# Patient Record
Sex: Female | Born: 1945 | Race: White | Hispanic: No | Marital: Married | Smoking: Former smoker
Health system: Southern US, Community
[De-identification: ages and names within clinical notes are randomized; demographics above are authoritative.]

## PROBLEM LIST (undated history)

## (undated) DIAGNOSIS — Z8489 Family history of other specified conditions: Secondary | ICD-10-CM

## (undated) DIAGNOSIS — T8859XA Other complications of anesthesia, initial encounter: Secondary | ICD-10-CM

## (undated) DIAGNOSIS — L409 Psoriasis, unspecified: Secondary | ICD-10-CM

## (undated) DIAGNOSIS — F419 Anxiety disorder, unspecified: Secondary | ICD-10-CM

## (undated) DIAGNOSIS — I10 Essential (primary) hypertension: Secondary | ICD-10-CM

## (undated) DIAGNOSIS — H3530 Unspecified macular degeneration: Secondary | ICD-10-CM

## (undated) DIAGNOSIS — Z9289 Personal history of other medical treatment: Secondary | ICD-10-CM

## (undated) DIAGNOSIS — M21371 Foot drop, right foot: Secondary | ICD-10-CM

## (undated) DIAGNOSIS — T4145XA Adverse effect of unspecified anesthetic, initial encounter: Secondary | ICD-10-CM

## (undated) HISTORY — PX: HEMORRHOID SURGERY: SHX153

## (undated) HISTORY — PX: LUMBAR FUSION: SHX111

## (undated) HISTORY — PX: COLONOSCOPY: SHX174

---

## 2004-07-31 HISTORY — PX: JOINT REPLACEMENT: SHX530

## 2005-01-09 ENCOUNTER — Ambulatory Visit (HOSPITAL_COMMUNITY): Admission: RE | Admit: 2005-01-09 | Discharge: 2005-01-12 | Payer: Self-pay | Admitting: Orthopedic Surgery

## 2007-04-18 ENCOUNTER — Ambulatory Visit (HOSPITAL_COMMUNITY): Admission: RE | Admit: 2007-04-18 | Discharge: 2007-04-18 | Payer: Self-pay | Admitting: Orthopedic Surgery

## 2010-12-16 NOTE — Op Note (Signed)
NAMENONIE, LOCHNER              ACCOUNT NO.:  [id number]   MEDICAL RECORD NO.:  [id number]          PATIENT TYPE:  OIB   LOCATION:  5022                         FACILITY:  [facility]   PHYSICIAN:  Mila Homer. Sherlean Foot, M.D. DATE OF BIRTH:  May 19, 1946   DATE OF PROCEDURE:  01/09/2005  DATE OF DISCHARGE:                                 OPERATIVE REPORT   SURGEON:  Mila Homer. Sherlean Foot, M.D.   ASSISTANT:  Richardean Canal, P.A.-C.   ANESTHESIA:  General.   PREOPERATIVE DIAGNOSIS:  Left knee osteoarthritis.   POSTOPERATIVE DIAGNOSIS:  Left knee osteoarthritis.   PROCEDURE:  Left total knee arthroplasty.   INDICATIONS FOR PROCEDURE:  The patient is a 65 year old white female with  failure of conservative measures for osteoarthritis of the knee.  Informed  consent was obtained.   DESCRIPTION OF PROCEDURE:  The patient was laid supine and administered  general anesthesia after a preoperative femoral nerve block.  A Foley  catheter was placed.  The left lower extremity was then prepped and draped  in the usual sterile fashion.  A #10 blade was used to make a standard  midline incision approximately 10 cm in length.  A fresh blade was used to  make a straight median parapatellar arthrotomy.  A synovectomy was also  performed.  I then everted the patella measured at 21 mm thick, reamed down  to 12 mm thick, and used a 32 mm template to drill three lug holes.  With  the prosthetic trial in place, 21 mm thickness had been recreated.  I then  went into flexion.  I used the extramedullary alignment guide on the tibia  to make a perpendicular cut to the anatomic axis removing 2 mm of bone off  the joint surface.  I then removed the extramedullary guide and made an  intramedullary drill hole in the femur.  I then put the intramedullary guide  set on 6 degrees valgus cut and made the cut with a sagittal saw.  I then  marked out the epicondylar axis, posterior angle measured 3 degrees, I then  pinned the  size E cutting block into place and made the anterior, posterior,  and chamfer cuts with a sagittal saw.  At this point, I removed the cut  surfaces of the bone and the cutting block.  I then placed the laminar  spreader in the knee, removed the medial and lateral meniscus, ACL, PCL, and  posterior condylar osteophytes.  A 12 spacer block fit easily in flexion and  extension, balance was good, alignment was excellent.  I then finished the  tibia with a size 4 tibial tray during keel, sized the femur with a size E  cutting for the box.  I then trialed with a size 4 tibia, size E femur, size  12 insert, and 32 patella.  The patella tracked well.  The drop in angle was  125 degrees with flexion and extension gap balance excellent.  I then  removed the trial components and copiously irrigated.  I then cemented in  the tibial first, femur second, and removed all excess cement.  I then  snapped in the 12 mm polyethylene, located the knee in extension, and  cemented in the patella and removed excess cement there.  Once the cement  was hardened, I let the tourniquet down at 49 minutes.  I left a drain  coming out superolaterally and deep to the arthrotomy.  I closed the  arthrotomy with interrupted #1 Vicryl sutures, the deep soft tissues with  interrupted 0 Vicryl sutures, and a subcuticular 2-0 Vicryl stitch, and skin  staples.  I dressed the knee with Xeroform dressings, sponges, sterile  Webril and Ace wrap.  Complications none.  Drains one Hemovac.  Estimated  blood loss 300 mL.       SDL/MEDQ  D:  01/11/2005  T:  01/11/2005  Job:  [id number]

## 2010-12-16 NOTE — Discharge Summary (Signed)
NAMESIVAN, CUELLO              ACCOUNT NO.:  [id number]   MEDICAL RECORD NO.:  [id number]          PATIENT TYPE:  OIB   LOCATION:  5022                         FACILITY:  [facility]   PHYSICIAN:  Mila Homer. Sherlean Foot, M.D. DATE OF BIRTH:  1946/06/29   DATE OF ADMISSION:  01/09/2005  DATE OF DISCHARGE:  01/12/2005                                 DISCHARGE SUMMARY   ADMISSION DIAGNOSES:  1.  Severe osteoarthritis left knee.  2.  Hypertension.  3.  History of anxiety depression.   DISCHARGE DIAGNOSES:  1.  Left total knee arthroplasty.  2.  Asymptomatic postoperative  blood loss anemia.  3.  History of hypertension.  4.  History of anxiety depression.   PROCEDURE:  On January 09, 2005, patient was taken to the OR by Dr. Mila Homer.  Lucey, assisted by Richardean Canal, P.A.-C.  Under general anesthesia, the  patient underwent a left total knee arthroplasty without any complications.  The patient had the following components implanted, a size E left femoral  component, size 4 fluted stemmed tibial component, size 32 mm patella, a  size 12 mm polyethylene bearing.  All components were implanted with  polymethyl methacrylate.  The patient tolerated that well.  She was  transferred to the recovery room with IV antibiotics and pain medications,  her leg in the CPM and a total knee protocol on Lovenox for DVT prophylaxis.   CONSULTATIONS:  The following routine consults were requested:  1.  Physical therapy.  2.  Occupational therapy.   HOSPITAL COURSE:  On January 09, 2005, patient was admitted to [facility] under the creatinine of Mila Homer. Sherlean Foot, M.D.  Patient  was taken to the OR where a left total knee arthroplasty was performed  without any complications.  The patient tolerated the procedure well and was  transferred to the recovery room, placed in a CPM and then transferred to  the orthopedic floor for a total knee protocol with IV pain medications and  antibiotics.   Patient then incurred a total of three days postoperative  course in which she worked very well with physical therapy.  Patient  transitioned from IV medications to p.o. medications very well without any  problems.  She did develop some postoperative blood loss anemia but her  vital signs remained stable and the patient tolerated it well so no  transfusion was required.  No medical issues or concerns occurred.  So it  was felt on postoperative day #3, with the patient's vital signs stable, her  wound benign for any signs of infection, her leg remained neuromotor  vascularly intact and worked very well with physical therapy, that the  patient be discharged to home for outpatient home health physical therapy  per total knee protocol.  Patient was discharged in good condition.   LABORATORY DATA:  CBC on January 11, 2005, wbc 9.4, hemoglobin 9.7, hematocrit  28.1, platelets 208.  Routine chemistries, sodium 137, potassium 3.5,  glucose 121 felt to be postoperative stress and elevation from inactivity,  was 103 on admission, no change in treatment.  BUN  7, creatinine 0.8.   MEDICATIONS ON DISCHARGE FROM ORTHOPEDIC FLOOR:  1.  Colace 100 mg p.o. b.i.d.  2.  Trinsicon one tablet p.o. t.i.d.  3.  Lovenox 30 mg injection q.12h.  4.  Norvasc 5 mg p.o. daily.  5.  Lotensin 10 mg p.o. daily.  6.  Prozac 20 mg p.o. daily.  7.  Laxative or enema of choice p.r.n.  8.  Percocet one or two tablets every four to six hours p.r.n.  9.  Tylenol 650 mg p.o. q.4h. p.r.n.  10. Robaxin 500 mg p.o. q.6h. p.r.n.  11. Reglan 10 mg p.o. q.8h. p.r.n.   DISCHARGE INSTRUCTIONS:  1.  Diet:  No restrictions.  2.  Activity:  Patient has no restrictions, may be weightbearing as      tolerated with use of a walker.  May shower on Saturday.  May go up and      down steps.  Patient should wear TEDs on the surgical leg for two to      three weeks until swelling is gone.   WOUND CARE:  Patient should change dressing daily.   If any concerns of  infection or knee problems, patient is to call Dr. Tobin Chad office for  advise.   MEDICATIONS:  1.  Percocet 5 mg one or two tablets q.4-6h. for pain if needed.  2.  Robaxin 500 mg one tablet q.6-8h. for muscle spasms if needed.  3.  Lovenox 40 mg injection once a day for the next 10 days.  4.  Patient resume routine home medications.   FOLLOW UP:  Patient needs a follow-up appointment with Dr. Sherlean Foot in three to  four weeks in our [place], [place], office.  Patient is to call  [phone] for a follow-up appointment.  Patient needs to contact in 10  days local orthopedist for evaluation of the wound, staple removal, Steri-  Strips application since the patient does live in [place] and Dr.  Sherlean Foot and the patient have agreed to this course of action.   CONDITION ON DISCHARGE:  Improved and good.      Robe   RWK/MEDQ  D:  01/12/2005  T:  01/12/2005  Job:  [id number]

## 2014-12-29 ENCOUNTER — Other Ambulatory Visit: Payer: Self-pay | Admitting: Neurosurgery

## 2015-03-01 ENCOUNTER — Encounter (HOSPITAL_COMMUNITY)
Admission: RE | Admit: 2015-03-01 | Discharge: 2015-03-01 | Disposition: A | Discharge status: Home / Self Care | Payer: Medicare Other | Source: Ambulatory Visit | Attending: Neurosurgery | Admitting: Neurosurgery

## 2015-03-01 ENCOUNTER — Other Ambulatory Visit: Payer: Self-pay

## 2015-03-01 ENCOUNTER — Encounter (HOSPITAL_COMMUNITY): Payer: Self-pay

## 2015-03-01 HISTORY — DX: Essential (primary) hypertension: I10

## 2015-03-01 HISTORY — DX: Psoriasis, unspecified: L40.9

## 2015-03-01 HISTORY — DX: Anxiety disorder, unspecified: F41.9

## 2015-03-01 LAB — TYPE AND SCREEN
ABO/RH(D): B NEG
ANTIBODY SCREEN: NEGATIVE

## 2015-03-01 LAB — BASIC METABOLIC PANEL
ANION GAP: 7 (ref 5–15)
BUN: 12 mg/dL (ref 6–20)
CALCIUM: 9 mg/dL (ref 8.9–10.3)
CHLORIDE: 100 mmol/L — AB (ref 101–111)
CO2: 30 mmol/L (ref 22–32)
Creatinine, Ser: 0.79 mg/dL (ref 0.44–1.00)
Glucose, Bld: 93 mg/dL (ref 65–99)
Potassium: 3.8 mmol/L (ref 3.5–5.1)
SODIUM: 137 mmol/L (ref 135–145)

## 2015-03-01 LAB — CBC
HEMATOCRIT: 41.2 % (ref 36.0–46.0)
Hemoglobin: 13.5 g/dL (ref 12.0–15.0)
MCH: 31.3 pg (ref 26.0–34.0)
MCHC: 32.8 g/dL (ref 30.0–36.0)
MCV: 95.6 fL (ref 78.0–100.0)
PLATELETS: 251 10*3/uL (ref 150–400)
RBC: 4.31 MIL/uL (ref 3.87–5.11)
RDW: 12.3 % (ref 11.5–15.5)
WBC: 6.3 10*3/uL (ref 4.0–10.5)

## 2015-03-01 LAB — ABO/RH: ABO/RH(D): B NEG

## 2015-03-01 LAB — SURGICAL PCR SCREEN
MRSA, PCR: NEGATIVE
STAPHYLOCOCCUS AUREUS: NEGATIVE

## 2015-03-01 NOTE — Pre-Procedure Instructions (Signed)
    Hannah Henson  03/01/2015     No Pharmacies Listed   Your procedure is scheduled on March 04, 2015  Report to [facility] Admitting at 5:30 A.M.  Call this number if you have problems the morning of surgery:  [phone]   Remember:  Do not eat food or drink liquids after midnight.  Take these medicines the morning of surgery with A SIP OF WATER : venlafaxine (EFFEXOR)   STOP ASPIRIN, HERBAL MEDICATIONS 7 DAYS PRIOR TO SURGERY   Do not wear jewelry, make-up or nail polish.  Do not wear lotions, powders, or perfumes.  You may wear deodorant.  Do not shave 48 hours prior to surgery.    Do not bring valuables to the hospital.  [facility] is not responsible for any belongings or valuables.  Contacts, dentures or bridgework may not be worn into surgery.  Leave your suitcase in the car.  After surgery it may be brought to your room.  For patients admitted to the hospital, discharge time will be determined by your treatment team.  Patients discharged the day of surgery will not be allowed to drive home.   Name and phone number of your driver:    Special instructions:  "PREPARING FOR SURGERY"  Please read over the following fact sheets that you were given. Pain Booklet, Coughing and Deep Breathing, Blood Transfusion Information and Surgical Site Infection Prevention

## 2015-03-02 ENCOUNTER — Encounter (HOSPITAL_COMMUNITY): Payer: Self-pay

## 2015-03-02 NOTE — Progress Notes (Signed)
Anesthesia Chart Review: Patient is a 69 year old female scheduled for L4-5 laminectomy with PLIF on 03/04/15 by Dr. Lovell Sheehan.  History includes former smoker, HTN, anxiety, plaque psoriasis, left TKR '06. Patient lives in [place], [place]. She has friends in the area, and has had previous surgeries at [facility]. She prefers our hospital over the hospitals in [place]. PCP is Dr. Marrian Salvage.   Meds include Humira every 14 days (last dose 02/23/15 1 AM), Lotrel, Tranxene, Effexor, melatonin.   EKG 03/01/15 NSR, possible inferior infarct (age undetermined). No comparison EKGs available. She denied CP, SOB, syncope, palpitation history. Chronic mild ankle edema that improves with BLE elevation. No known She walks up 10 stairs on a regular basis at home, but overall activity is limited due to her back issues. She ambulates with a cane since ~ 07/2014. Overall, she feels she lives an active life. 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   Preoperative labs noted.   She denied CV symptoms. Reviewed above with anesthesiologist Dr. Maple Hudson who agrees that if no acute changes then anticipate that she can proceed as planned. Of note, patient did ask that I notify Dr. York Ram office of her last dose and next scheduled dose Humira, so he can make any recommendations on when to resume. I have left a voice message with Darl Pikes at his office. I also encouraged patient to clarify with Dr. Lovell Sheehan when she meets with him during her hospitalization.  Velna Ochs [facility]/Anesthesiology Phone [phone] 03/02/2015 1:12 PM

## 2015-03-04 ENCOUNTER — Encounter (HOSPITAL_COMMUNITY)
Admission: RE | Disposition: A | Discharge status: Home / Self Care | Payer: Self-pay | Source: Ambulatory Visit | Attending: Neurosurgery

## 2015-03-04 ENCOUNTER — Inpatient Hospital Stay (HOSPITAL_COMMUNITY): Payer: Medicare Other

## 2015-03-04 ENCOUNTER — Inpatient Hospital Stay (HOSPITAL_COMMUNITY)
Admission: RE | Admit: 2015-03-04 | Discharge: 2015-03-07 | DRG: 460 | Disposition: A | Discharge status: Home / Self Care | Payer: Medicare Other | Source: Ambulatory Visit | Attending: Neurosurgery | Admitting: Neurosurgery

## 2015-03-04 ENCOUNTER — Inpatient Hospital Stay (HOSPITAL_COMMUNITY): Payer: Medicare Other | Admitting: Certified Registered Nurse Anesthetist

## 2015-03-04 ENCOUNTER — Encounter (HOSPITAL_COMMUNITY): Payer: Self-pay | Admitting: *Deleted

## 2015-03-04 ENCOUNTER — Inpatient Hospital Stay (HOSPITAL_COMMUNITY): Payer: Medicare Other | Admitting: Vascular Surgery

## 2015-03-04 DIAGNOSIS — M5116 Intervertebral disc disorders with radiculopathy, lumbar region: Secondary | ICD-10-CM | POA: Diagnosis present

## 2015-03-04 DIAGNOSIS — I10 Essential (primary) hypertension: Secondary | ICD-10-CM | POA: Diagnosis present

## 2015-03-04 DIAGNOSIS — M4806 Spinal stenosis, lumbar region: Secondary | ICD-10-CM | POA: Diagnosis present

## 2015-03-04 DIAGNOSIS — Z87891 Personal history of nicotine dependence: Secondary | ICD-10-CM | POA: Diagnosis not present

## 2015-03-04 DIAGNOSIS — M21371 Foot drop, right foot: Secondary | ICD-10-CM | POA: Diagnosis present

## 2015-03-04 DIAGNOSIS — Z96652 Presence of left artificial knee joint: Secondary | ICD-10-CM | POA: Diagnosis present

## 2015-03-04 DIAGNOSIS — L409 Psoriasis, unspecified: Secondary | ICD-10-CM | POA: Diagnosis present

## 2015-03-04 DIAGNOSIS — M79604 Pain in right leg: Secondary | ICD-10-CM | POA: Diagnosis present

## 2015-03-04 DIAGNOSIS — Z419 Encounter for procedure for purposes other than remedying health state, unspecified: Secondary | ICD-10-CM

## 2015-03-04 DIAGNOSIS — M4316 Spondylolisthesis, lumbar region: Secondary | ICD-10-CM | POA: Diagnosis present

## 2015-03-04 DIAGNOSIS — F419 Anxiety disorder, unspecified: Secondary | ICD-10-CM | POA: Diagnosis present

## 2015-03-04 LAB — GLUCOSE, CAPILLARY: Glucose-Capillary: 106 mg/dL — ABNORMAL HIGH (ref 65–99)

## 2015-03-04 SURGERY — POSTERIOR LUMBAR FUSION 1 LEVEL
Anesthesia: General

## 2015-03-04 MED ORDER — FENTANYL CITRATE (PF) 250 MCG/5ML IJ SOLN
INTRAMUSCULAR | Status: AC
  Filled 2015-03-04: qty 5

## 2015-03-04 MED ORDER — BACITRACIN ZINC 500 UNIT/GM EX OINT
TOPICAL_OINTMENT | CUTANEOUS | Status: DC | PRN
  Administered 2015-03-04: 1 via TOPICAL

## 2015-03-04 MED ORDER — SODIUM CHLORIDE 0.9 % IR SOLN
Status: DC | PRN
  Administered 2015-03-04: 09:00:00

## 2015-03-04 MED ORDER — CEFAZOLIN SODIUM-DEXTROSE 2-3 GM-% IV SOLR
2.0000 g | INTRAVENOUS | Status: AC
  Administered 2015-03-04: 2 g via INTRAVENOUS

## 2015-03-04 MED ORDER — HYDROCODONE-ACETAMINOPHEN 5-325 MG PO TABS
1.0000 | ORAL_TABLET | ORAL | Status: DC | PRN
  Administered 2015-03-05 – 2015-03-07 (×8): 2 via ORAL
  Filled 2015-03-04 (×8): qty 2

## 2015-03-04 MED ORDER — AMLODIPINE BESY-BENAZEPRIL HCL 5-10 MG PO CAPS: 1.0000 | ORAL_CAPSULE | Freq: Every day | ORAL | Status: DC

## 2015-03-04 MED ORDER — HYDROMORPHONE HCL 1 MG/ML IJ SOLN
0.2500 mg | INTRAMUSCULAR | Status: DC | PRN
  Administered 2015-03-04 (×4): 0.5 mg via INTRAVENOUS

## 2015-03-04 MED ORDER — THROMBIN 20000 UNITS EX SOLR
CUTANEOUS | Status: DC | PRN
  Administered 2015-03-04: 10:00:00 via TOPICAL

## 2015-03-04 MED ORDER — VENLAFAXINE HCL 37.5 MG PO TABS
75.0000 mg | ORAL_TABLET | Freq: Every day | ORAL | Status: DC
  Administered 2015-03-05 – 2015-03-07 (×3): 75 mg via ORAL
  Filled 2015-03-04 (×3): qty 2

## 2015-03-04 MED ORDER — BUPIVACAINE LIPOSOME 1.3 % IJ SUSP
20.0000 mL | Freq: Once | INTRAMUSCULAR | Status: DC
  Filled 2015-03-04: qty 20

## 2015-03-04 MED ORDER — AMLODIPINE BESYLATE 5 MG PO TABS
5.0000 mg | ORAL_TABLET | Freq: Every day | ORAL | Status: DC
  Administered 2015-03-05 – 2015-03-07 (×3): 5 mg via ORAL
  Filled 2015-03-04 (×3): qty 1

## 2015-03-04 MED ORDER — LACTATED RINGERS IV SOLN
INTRAVENOUS | Status: DC
  Administered 2015-03-05: 01:00:00 via INTRAVENOUS

## 2015-03-04 MED ORDER — PROPOFOL 10 MG/ML IV BOLUS
INTRAVENOUS | Status: DC | PRN
  Administered 2015-03-04: 150 mg via INTRAVENOUS

## 2015-03-04 MED ORDER — MENTHOL 3 MG MT LOZG: 1.0000 | LOZENGE | OROMUCOSAL | Status: DC | PRN

## 2015-03-04 MED ORDER — ONDANSETRON HCL 4 MG/2ML IJ SOLN
INTRAMUSCULAR | Status: DC | PRN
  Administered 2015-03-04: 4 mg via INTRAVENOUS

## 2015-03-04 MED ORDER — EPHEDRINE SULFATE 50 MG/ML IJ SOLN
INTRAMUSCULAR | Status: DC | PRN
  Administered 2015-03-04 (×5): 5 mg via INTRAVENOUS

## 2015-03-04 MED ORDER — MECLIZINE HCL 25 MG PO TABS
25.0000 mg | ORAL_TABLET | Freq: Once | ORAL | Status: DC
  Filled 2015-03-04: qty 1

## 2015-03-04 MED ORDER — MEPERIDINE HCL 25 MG/ML IJ SOLN: 6.2500 mg | INTRAMUSCULAR | Status: DC | PRN

## 2015-03-04 MED ORDER — PHENOL 1.4 % MT LIQD: 1.0000 | OROMUCOSAL | Status: DC | PRN

## 2015-03-04 MED ORDER — CLORAZEPATE DIPOTASSIUM 3.75 MG PO TABS
7.5000 mg | ORAL_TABLET | Freq: Two times a day (BID) | ORAL | Status: DC | PRN
  Administered 2015-03-05: 7.5 mg via ORAL
  Filled 2015-03-04: qty 2

## 2015-03-04 MED ORDER — DOCUSATE SODIUM 100 MG PO CAPS
100.0000 mg | ORAL_CAPSULE | Freq: Two times a day (BID) | ORAL | Status: DC
Start: 2015-03-04 — End: 2015-03-07
  Administered 2015-03-04 – 2015-03-07 (×5): 100 mg via ORAL
  Filled 2015-03-04 (×5): qty 1

## 2015-03-04 MED ORDER — ACETAMINOPHEN 10 MG/ML IV SOLN
INTRAVENOUS | Status: DC | PRN
  Administered 2015-03-04: 1000 mg via INTRAVENOUS

## 2015-03-04 MED ORDER — MORPHINE SULFATE 2 MG/ML IJ SOLN
1.0000 mg | INTRAMUSCULAR | Status: DC | PRN
  Administered 2015-03-04 – 2015-03-05 (×3): 2 mg via INTRAVENOUS
  Filled 2015-03-04 (×3): qty 1

## 2015-03-04 MED ORDER — CEFAZOLIN SODIUM-DEXTROSE 2-3 GM-% IV SOLR
2.0000 g | Freq: Three times a day (TID) | INTRAVENOUS | Status: AC
  Administered 2015-03-04 – 2015-03-05 (×2): 2 g via INTRAVENOUS
  Filled 2015-03-04 (×2): qty 50

## 2015-03-04 MED ORDER — VANCOMYCIN HCL 1000 MG IV SOLR
INTRAVENOUS | Status: DC | PRN
  Administered 2015-03-04: 1000 mg via TOPICAL

## 2015-03-04 MED ORDER — LACTATED RINGERS IV SOLN
INTRAVENOUS | Status: DC
  Administered 2015-03-04: 17:00:00 via INTRAVENOUS

## 2015-03-04 MED ORDER — NEOSTIGMINE METHYLSULFATE 10 MG/10ML IV SOLN
INTRAVENOUS | Status: DC | PRN
  Administered 2015-03-04: 4 mg via INTRAVENOUS

## 2015-03-04 MED ORDER — ONDANSETRON HCL 4 MG/2ML IJ SOLN
INTRAMUSCULAR | Status: AC
  Filled 2015-03-04: qty 2

## 2015-03-04 MED ORDER — LIDOCAINE HCL (CARDIAC) 20 MG/ML IV SOLN
INTRAVENOUS | Status: AC
  Filled 2015-03-04: qty 5

## 2015-03-04 MED ORDER — FENTANYL CITRATE (PF) 100 MCG/2ML IJ SOLN
INTRAMUSCULAR | Status: DC | PRN
  Administered 2015-03-04: 50 ug via INTRAVENOUS
  Administered 2015-03-04: 25 ug via INTRAVENOUS
  Administered 2015-03-04 (×2): 50 ug via INTRAVENOUS
  Administered 2015-03-04: 25 ug via INTRAVENOUS
  Administered 2015-03-04: 50 ug via INTRAVENOUS

## 2015-03-04 MED ORDER — MIDAZOLAM HCL 2 MG/2ML IJ SOLN
INTRAMUSCULAR | Status: AC
  Filled 2015-03-04: qty 4

## 2015-03-04 MED ORDER — BENAZEPRIL HCL 20 MG PO TABS
10.0000 mg | ORAL_TABLET | Freq: Every day | ORAL | Status: DC
  Administered 2015-03-05 – 2015-03-07 (×3): 10 mg via ORAL
  Filled 2015-03-04 (×3): qty 1

## 2015-03-04 MED ORDER — LACTATED RINGERS IV SOLN
INTRAVENOUS | Status: DC
  Administered 2015-03-04 (×2): via INTRAVENOUS

## 2015-03-04 MED ORDER — MANAGING BACK PAIN BOOK
Freq: Once | Status: AC
  Administered 2015-03-05
  Filled 2015-03-04: qty 1

## 2015-03-04 MED ORDER — PHENYLEPHRINE HCL 10 MG/ML IJ SOLN
INTRAMUSCULAR | Status: DC | PRN
  Administered 2015-03-04: 40 ug via INTRAVENOUS
  Administered 2015-03-04 (×3): 80 ug via INTRAVENOUS

## 2015-03-04 MED ORDER — HYDROMORPHONE HCL 1 MG/ML IJ SOLN
INTRAMUSCULAR | Status: AC
  Administered 2015-03-04: 0.5 mg via INTRAVENOUS
  Filled 2015-03-04: qty 1

## 2015-03-04 MED ORDER — ACETAMINOPHEN 650 MG RE SUPP: 650.0000 mg | RECTAL | Status: DC | PRN

## 2015-03-04 MED ORDER — ACETAMINOPHEN 160 MG/5ML PO SOLN
960.0000 mg | Freq: Once | ORAL | Status: DC
  Filled 2015-03-04: qty 30

## 2015-03-04 MED ORDER — DIAZEPAM 5 MG PO TABS
5.0000 mg | ORAL_TABLET | Freq: Four times a day (QID) | ORAL | Status: DC | PRN
  Administered 2015-03-04 – 2015-03-07 (×7): 5 mg via ORAL
  Filled 2015-03-04 (×7): qty 1

## 2015-03-04 MED ORDER — BISACODYL 10 MG RE SUPP: 10.0000 mg | Freq: Every day | RECTAL | Status: DC | PRN

## 2015-03-04 MED ORDER — BUPIVACAINE LIPOSOME 1.3 % IJ SUSP
INTRAMUSCULAR | Status: DC | PRN
  Administered 2015-03-04: 20 mL

## 2015-03-04 MED ORDER — PROMETHAZINE HCL 25 MG/ML IJ SOLN: 6.2500 mg | INTRAMUSCULAR | Status: DC | PRN

## 2015-03-04 MED ORDER — CEFAZOLIN SODIUM-DEXTROSE 2-3 GM-% IV SOLR
INTRAVENOUS | Status: AC
Start: 2015-03-04 — End: 2015-03-04
  Filled 2015-03-04: qty 50

## 2015-03-04 MED ORDER — OXYCODONE-ACETAMINOPHEN 5-325 MG PO TABS
1.0000 | ORAL_TABLET | ORAL | Status: DC | PRN
  Administered 2015-03-04: 2 via ORAL
  Administered 2015-03-04: 1 via ORAL
  Administered 2015-03-05: 2 via ORAL
  Filled 2015-03-04: qty 2
  Filled 2015-03-04: qty 1
  Filled 2015-03-04: qty 2

## 2015-03-04 MED ORDER — 0.9 % SODIUM CHLORIDE (POUR BTL) OPTIME
TOPICAL | Status: DC | PRN
  Administered 2015-03-04: 1000 mL

## 2015-03-04 MED ORDER — ALUM & MAG HYDROXIDE-SIMETH 200-200-20 MG/5ML PO SUSP: 30.0000 mL | Freq: Four times a day (QID) | ORAL | Status: DC | PRN

## 2015-03-04 MED ORDER — DEXTROSE 5 % IV SOLN
10.0000 mg | INTRAVENOUS | Status: DC | PRN
  Administered 2015-03-04: 15 ug/min via INTRAVENOUS

## 2015-03-04 MED ORDER — FAMOTIDINE 20 MG PO TABS
20.0000 mg | ORAL_TABLET | Freq: Once | ORAL | Status: DC
  Filled 2015-03-04: qty 1

## 2015-03-04 MED ORDER — ACETAMINOPHEN 10 MG/ML IV SOLN
INTRAVENOUS | Status: AC
  Filled 2015-03-04: qty 100

## 2015-03-04 MED ORDER — ACETAMINOPHEN 500 MG PO TABS: 1000.0000 mg | ORAL_TABLET | Freq: Once | ORAL | Status: DC

## 2015-03-04 MED ORDER — MIDAZOLAM HCL 5 MG/5ML IJ SOLN
INTRAMUSCULAR | Status: DC | PRN
  Administered 2015-03-04: 2 mg via INTRAVENOUS

## 2015-03-04 MED ORDER — ROCURONIUM BROMIDE 100 MG/10ML IV SOLN
INTRAVENOUS | Status: DC | PRN
  Administered 2015-03-04: 40 mg via INTRAVENOUS
  Administered 2015-03-04: 10 mg via INTRAVENOUS

## 2015-03-04 MED ORDER — BUPIVACAINE-EPINEPHRINE (PF) 0.5% -1:200000 IJ SOLN
INTRAMUSCULAR | Status: DC | PRN
  Administered 2015-03-04: 10 mL via PERINEURAL

## 2015-03-04 MED ORDER — LIDOCAINE HCL (CARDIAC) 20 MG/ML IV SOLN
INTRAVENOUS | Status: DC | PRN
  Administered 2015-03-04: 80 mg via INTRAVENOUS

## 2015-03-04 MED ORDER — PROPOFOL 10 MG/ML IV BOLUS
INTRAVENOUS | Status: AC
  Filled 2015-03-04: qty 20

## 2015-03-04 MED ORDER — VANCOMYCIN HCL 1000 MG IV SOLR
INTRAVENOUS | Status: AC
  Filled 2015-03-04: qty 1000

## 2015-03-04 MED ORDER — ACETAMINOPHEN 325 MG PO TABS: 650.0000 mg | ORAL_TABLET | ORAL | Status: DC | PRN

## 2015-03-04 MED ORDER — DIAZEPAM 5 MG PO TABS
ORAL_TABLET | ORAL | Status: AC
  Administered 2015-03-04: 5 mg via ORAL
  Filled 2015-03-04: qty 1

## 2015-03-04 MED ORDER — GLYCOPYRROLATE 0.2 MG/ML IJ SOLN
INTRAMUSCULAR | Status: DC | PRN
  Administered 2015-03-04: .6 mg via INTRAVENOUS

## 2015-03-04 MED ORDER — ONDANSETRON HCL 4 MG/2ML IJ SOLN: 4.0000 mg | INTRAMUSCULAR | Status: DC | PRN

## 2015-03-04 MED ORDER — ROCURONIUM BROMIDE 50 MG/5ML IV SOLN
INTRAVENOUS | Status: AC
  Filled 2015-03-04: qty 1

## 2015-03-04 SURGICAL SUPPLY — 64 items
APL SKNCLS STERI-STRIP NONHPOA (GAUZE/BANDAGES/DRESSINGS) ×1
BAG DECANTER FOR FLEXI CONT (MISCELLANEOUS) ×3 IMPLANT
BENZOIN TINCTURE PRP APPL 2/3 (GAUZE/BANDAGES/DRESSINGS) ×3 IMPLANT
BLADE CLIPPER SURG (BLADE) IMPLANT
BRUSH SCRUB EZ PLAIN DRY (MISCELLANEOUS) ×3 IMPLANT
BUR MATCHSTICK NEURO 3.0 LAGG (BURR) ×3 IMPLANT
BUR PRECISION FLUTE 6.0 (BURR) ×3 IMPLANT
CANISTER SUCT 3000ML PPV (MISCELLANEOUS) ×3 IMPLANT
CAP REVERE LOCKING (Cap) ×8 IMPLANT
CLOSURE WOUND 1/2 X4 (GAUZE/BANDAGES/DRESSINGS) ×1
CONT SPEC 4OZ CLIKSEAL STRL BL (MISCELLANEOUS) ×3 IMPLANT
COVER BACK TABLE 60X90IN (DRAPES) ×3 IMPLANT
DRAPE C-ARM 42X72 X-RAY (DRAPES) ×6 IMPLANT
DRAPE LAPAROTOMY 100X72X124 (DRAPES) ×3 IMPLANT
DRAPE POUCH INSTRU U-SHP 10X18 (DRAPES) ×3 IMPLANT
DRAPE PROXIMA HALF (DRAPES) ×3 IMPLANT
DRAPE SURG 17X23 STRL (DRAPES) ×12 IMPLANT
ELECT BLADE 4.0 EZ CLEAN MEGAD (MISCELLANEOUS) ×3
ELECT REM PT RETURN 9FT ADLT (ELECTROSURGICAL) ×3
ELECTRODE BLDE 4.0 EZ CLN MEGD (MISCELLANEOUS) ×1 IMPLANT
ELECTRODE REM PT RTRN 9FT ADLT (ELECTROSURGICAL) ×1 IMPLANT
EVACUATOR 1/8 PVC DRAIN (DRAIN) ×2 IMPLANT
GAUZE SPONGE 4X4 12PLY STRL (GAUZE/BANDAGES/DRESSINGS) ×3 IMPLANT
GAUZE SPONGE 4X4 16PLY XRAY LF (GAUZE/BANDAGES/DRESSINGS) ×1 IMPLANT
GLOVE BIO SURGEON STRL SZ7 (GLOVE) ×4 IMPLANT
GLOVE BIO SURGEON STRL SZ8 (GLOVE) ×8 IMPLANT
GLOVE BIO SURGEON STRL SZ8.5 (GLOVE) ×6 IMPLANT
GLOVE EXAM NITRILE LRG STRL (GLOVE) IMPLANT
GLOVE EXAM NITRILE MD LF STRL (GLOVE) IMPLANT
GLOVE EXAM NITRILE XL STR (GLOVE) IMPLANT
GLOVE EXAM NITRILE XS STR PU (GLOVE) IMPLANT
GLOVE INDICATOR 7.5 STRL GRN (GLOVE) ×15 IMPLANT
GOWN STRL REUS W/ TWL LRG LVL3 (GOWN DISPOSABLE) IMPLANT
GOWN STRL REUS W/ TWL XL LVL3 (GOWN DISPOSABLE) ×2 IMPLANT
GOWN STRL REUS W/TWL 2XL LVL3 (GOWN DISPOSABLE) IMPLANT
GOWN STRL REUS W/TWL LRG LVL3 (GOWN DISPOSABLE) ×6
GOWN STRL REUS W/TWL XL LVL3 (GOWN DISPOSABLE) ×6
KIT BASIN OR (CUSTOM PROCEDURE TRAY) ×3 IMPLANT
KIT ROOM TURNOVER OR (KITS) ×3 IMPLANT
NDL HYPO 21X1.5 SAFETY (NEEDLE) IMPLANT
NEEDLE HYPO 21X1.5 SAFETY (NEEDLE) ×3 IMPLANT
NEEDLE HYPO 22GX1.5 SAFETY (NEEDLE) ×3 IMPLANT
NS IRRIG 1000ML POUR BTL (IV SOLUTION) ×3 IMPLANT
PACK LAMINECTOMY NEURO (CUSTOM PROCEDURE TRAY) ×3 IMPLANT
PAD ARMBOARD 7.5X6 YLW CONV (MISCELLANEOUS) ×9 IMPLANT
PATTIES SURGICAL .5 X1 (DISPOSABLE) IMPLANT
ROD REVERE 6.35 40MM (Rod) ×4 IMPLANT
SCREW REVERE 6.35 6.5X40MM (Screw) ×4 IMPLANT
SCREW REVERE 6.5X50MM (Screw) ×4 IMPLANT
SPACER ALTERA 10X31 8-12MM-8 (Spacer) ×2 IMPLANT
SPONGE LAP 4X18 X RAY DECT (DISPOSABLE) IMPLANT
SPONGE NEURO XRAY DETECT 1X3 (DISPOSABLE) IMPLANT
SPONGE SURGIFOAM ABS GEL 100 (HEMOSTASIS) ×3 IMPLANT
STRIP BIOACTIVE 20CC 25X100X8 (Miscellaneous) ×3 IMPLANT
STRIP CLOSURE SKIN 1/2X4 (GAUZE/BANDAGES/DRESSINGS) ×2 IMPLANT
SUT VIC AB 1 CT1 18XBRD ANBCTR (SUTURE) ×2 IMPLANT
SUT VIC AB 1 CT1 8-18 (SUTURE) ×6
SUT VIC AB 2-0 CP2 18 (SUTURE) ×6 IMPLANT
SYR 20ML ECCENTRIC (SYRINGE) ×3 IMPLANT
TAPE CLOTH SURG 4X10 WHT LF (GAUZE/BANDAGES/DRESSINGS) ×2 IMPLANT
TOWEL OR 17X24 6PK STRL BLUE (TOWEL DISPOSABLE) ×3 IMPLANT
TOWEL OR 17X26 10 PK STRL BLUE (TOWEL DISPOSABLE) ×3 IMPLANT
TRAY FOLEY W/METER SILVER 14FR (SET/KITS/TRAYS/PACK) ×3 IMPLANT
WATER STERILE IRR 1000ML POUR (IV SOLUTION) ×3 IMPLANT

## 2015-03-04 NOTE — Progress Notes (Signed)
Subjective:  The patient complains of right leg pain.  Objective: Vital signs in last 24 hours: Temp:  [97 F (36.1 C)-97.7 F (36.5 C)] 97 F (36.1 C) (08/04 1345) Pulse Rate:  [70-104] 81 (08/04 1430) Resp:  [18-20] 19 (08/04 1430) BP: (106-138)/(49-86) 118/77 mmHg (08/04 1430) SpO2:  [93 %-99 %] 99 % (08/04 1430) Weight:  [78.926 kg (174 lb)] 78.926 kg (174 lb) (08/04 0636)  Intake/Output from previous day:   Intake/Output this shift: Total I/O In: 1500 [I.V.:1500] Out: 555 [Urine:355; Blood:200]  Physical exam the patient is a bit somnolent. She complains of right leg pain. Her strength is normal except in her right EHL/dorsiflexors which is weak. Her dressing is clean and dry.  Lab Results: No results for input(s): WBC, HGB, HCT, PLT in the last 72 hours. BMET No results for input(s): NA, K, CL, CO2, GLUCOSE, BUN, CREATININE, CALCIUM in the last 72 hours.  Studies/Results: Dg Lumbar Spine 2-3 Views  03/04/2015   CLINICAL DATA:  L4-5 laminectomy and fusion.  Intraoperative films.  EXAM: DG C-ARM 61-120 MIN; LUMBAR SPINE - 2-3 VIEW  COMPARISON:  Plain films lumbar spine 11/16/2014.  FINDINGS: Two fluoroscopic intraoperative spot views demonstrate pedicle screws interbody spacer in place at L4-5. 0.8 cm anterolisthesis L4 on L5 is unchanged.  IMPRESSION: L4-5 PLIF in progress.   Electronically Signed   By: Drusilla Kanner M.D.   On: 03/04/2015 13:57   Dg Lumbar Spine 1 View  03/04/2015   CLINICAL DATA:  Posterior fusion.  EXAM: LUMBAR SPINE - 1 VIEW  COMPARISON:  Intraoperative images 8 10/2014. MRI lumbar spine 04/18/2007 .  FINDINGS: Lumbar spine numbered as per prior exam and per prior MRI of 04/18/2007. Metallic marker noted at L5-S1. Mild anterolisthesis L5-S1 again noted.  IMPRESSION: Metallic marker noted at L5-S1 level posteriorly.   Electronically Signed   By: Maisie Fus  Register   On: 03/04/2015 13:16   Dg Spine Portable 1 View  03/04/2015   CLINICAL DATA:  Lumbar spine  surgery.  EXAM: PORTABLE SPINE - 1 VIEW  COMPARISON:  Lumbar spine radiographs 11/16/2014. Lumbar spine MRI 10/26/2014 and 04/18/2007.  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 The vertebrae will be numbered as on the 04/18/2007 MRI, with S1 being considered lumbarized. There is slight anterolisthesis of L4 on L5 with more pronounced anterolisthesis of L5 on S1. The tip of a surgical probe projects posterior to the L5-S1 disc space, overlying the L5 spinous process.  IMPRESSION: Intraoperative image for localization purposes as above.  These results were called by telephone at the time of interpretation on 03/04/2015 at 10:56 am to Dr. Tressie Stalker , who verbally acknowledged these results.   Electronically Signed   By: Sebastian Ache   On: 03/04/2015 11:00   Dg C-arm 1-60 Min  03/04/2015   CLINICAL DATA:  L4-5 laminectomy and fusion.  Intraoperative films.  EXAM: DG C-ARM 61-120 MIN; LUMBAR SPINE - 2-3 VIEW  COMPARISON:  Plain films lumbar spine 11/16/2014.  FINDINGS: Two fluoroscopic intraoperative spot views demonstrate pedicle screws interbody spacer in place at L4-5. 0.8 cm anterolisthesis L4 on L5 is unchanged.  IMPRESSION: L4-5 PLIF in progress.   Electronically Signed   By: Drusilla Kanner M.D.   On: 03/04/2015 13:57    Assessment/Plan: The patient has some right leg pain and weakness, and she did preoperatively. I think this will resolve with time and medication.  LOS: 0 days     Odalis Jordan D 03/04/2015, 2:46 PM

## 2015-03-04 NOTE — Progress Notes (Signed)
All documentation in this column by Lucia Bitter is meant for 1355.

## 2015-03-04 NOTE — Transfer of Care (Signed)
Immediate Anesthesia Transfer of Care Note  Patient: Hannah Henson  Procedure(s) Performed: Procedure(s): POSTERIOR LUMBAR INTERBODY FUSION LUMBAR FOUR-FIVE WITH INTERBODY PROTHESIS WITH POSTERIOR LATERAL ARTHRODESIS AND POSTERIOR NONSEGMENTAL INSTRUMENTATION (N/A)  Patient Location: PACU  Anesthesia Type:General  Level of Consciousness: awake  Airway & Oxygen Therapy: Patient Spontanous Breathing and Patient connected to nasal cannula oxygen  Post-op Assessment: Report given to RN and Post -op Vital signs reviewed and stable  Post vital signs: Reviewed and stable  Last Vitals:  Filed Vitals:   03/04/15 0636  BP: 138/86  Pulse: 70  Temp: 36.5 C  Resp: 20    Complications: No apparent anesthesia complications

## 2015-03-04 NOTE — Anesthesia Procedure Notes (Signed)
Procedure Name: Intubation Date/Time: 03/04/2015 9:50 AM Performed by: Cyndie Chime Pre-anesthesia Checklist: Patient identified, Suction available, Emergency Drugs available and Patient being monitored Patient Re-evaluated:Patient Re-evaluated prior to inductionOxygen Delivery Method: Circle system utilized Preoxygenation: Pre-oxygenation with 100% oxygen Intubation Type: IV induction Ventilation: Oral airway inserted - appropriate to patient size and Mask ventilation without difficulty Laryngoscope Size: Mac and 3 Grade View: Grade I Tube type: Oral Tube size: 7.0 mm Number of attempts: 2 Airway Equipment and Method: Stylet Placement Confirmation: ETT inserted through vocal cords under direct vision,  positive ETCO2 and breath sounds checked- equal and bilateral Secured at: 22 cm Tube secured with: Tape Dental Injury: Teeth and Oropharynx as per pre-operative assessment  Comments: No complications noted. Atraumatic intubation. Dentition unchanged. 2nd attempt performed by Kaylyn Layer. Hart Rochester, MD, MBA

## 2015-03-04 NOTE — Progress Notes (Signed)
Patient admitted to 4N26 from PACU at 1545. Patient c/o pain in the right leg due to transferring to the bed. Reviewed safety and orders with the patient and family.

## 2015-03-04 NOTE — Op Note (Signed)
Brief history: The patient is a 69 year old white female who has complained of back and right leg pain. She has failed medical management has worked up with a lumbar MRI which demonstrated multilevel  degenerative changes most prominent at what I have called L4-5 (she has a transitional vertebrae which may affect the numbering system.) I discussed the various treatment options with the patient including surgery. She has weighed the risks, benefits, and alternative surgery and decided proceed with a L4-5 decompression, instrumentation, and fusion.  Preoperative diagnosis: L4-5 spondylolisthesis, Degenerative disc disease, spinal stenosis compressing both the L4 and the L5 nerve roots; lumbago; lumbar radiculopathy  Postoperative diagnosis: The same  Procedure: Bilateral L4 Laminotomy/foraminotomies to decompress the bilateral L4 and L5 nerve roots(the work required to do this was in addition to the work required to do the posterior lumbar interbody fusion because of the patient's spinal stenosis, facet arthropathy. Etc. requiring a wide decompression of the nerve roots.); L4-5 posterior lumbar interbody fusion with local morselized autograft bone and Kinnex graft extender; insertion of interbody prosthesis at L4-5 (globus peek expandable interbody prosthesis); posterior nonsegmental instrumentation from L4 to L5 with globus titanium pedicle screws and rods; posterior lateral arthrodesis at L4-5 with local morselized autograft bone and Kinnex bone graft extender.  Surgeon: Dr. Delma Officer  Asst.: Dr. Maeola Harman  Anesthesia: Gen. endotracheal  Estimated blood loss: 250 mL  Drains: One medium Hemovac  Complications: None  Description of procedure: The patient was brought to the operating room by the anesthesia team. General endotracheal anesthesia was induced. The patient was turned to the prone position on the Wilson frame. The patient's lumbosacral region was then prepared with Betadine scrub  and Betadine solution. Sterile drapes were applied.  I then injected the area to be incised with Marcaine with epinephrine solution. I then used the scalpel to make a linear midline incision over the L4-5 interspace. I then used electrocautery to perform a bilateral subperiosteal dissection exposing the spinous process and lamina of L4 and L5. We then obtained intraoperative radiograph to confirm our location. Of note the patient has a transitional vertebrae. This has been variously named. We then inserted the Verstrac retractor to provide exposure.  I began the decompression by using the high speed drill to perform laminotomies at L4 bilaterally. We then used the Kerrison punches to widen the laminotomy and removed the ligamentum flavum at L4-5. We used the Kerrison punches to remove the medial facets at L4-5. We performed wide foraminotomies about the bilateral L4 and L5 nerve roots completing the decompression.  We now turned our attention to the posterior lumbar interbody fusion. I used a scalpel to incise the intervertebral disc at L4-5 bilaterally. I then performed a partial intervertebral discectomy at L4-5 bilaterally using the pituitary forceps. We prepared the vertebral endplates at L4-5 bilaterally for the fusion by removing the soft tissues with the curettes. We then used the trial spacers to pick the appropriate sized interbody prosthesis. We prefilled his prosthesis with a combination of local morselized autograft bone that we obtained during the decompression as well as Kinnex bone graft extender. We inserted the prefilled prosthesis into the interspace at L4-5 and expanded the prosthesis. There was a good snug fit of the prosthesis in the interspace. We then filled and the remainder of the intervertebral disc space with local morselized autograft bone and Kinnex. This completed the posterior lumbar interbody arthrodesis.  We now turned attention to the instrumentation. Under fluoroscopic  guidance we cannulated the bilateral L4 and  L5 pedicles with the bone probe. We then removed the bone probe. We then tapped the pedicle with a 5.5 millimeter tap. We then removed the tap. We probed inside the tapped pedicle with a ball probe to rule out cortical breaches. We then inserted a 6.5 x 45 and 50 millimeter pedicle screw into the L4 and L5 pedicles bilaterally under fluoroscopic guidance. We then palpated along the medial aspect of the pedicles to rule out cortical breaches. There were none. The nerve roots were not injured. We then connected the unilateral pedicle screws with a lordotic rod. We compressed the construct and secured the rod in place with the caps. We then tightened the caps appropriately. This completed the instrumentation from L4-5.  We now turned our attention to the posterior lateral arthrodesis at L4-5. We used the high-speed drill to decorticate the remainder of the facets, pars, transverse process at L4 and L5. We then applied a combination of local morselized autograft bone and Kinnex bone graft extender over these decorticated posterior lateral structures. This completed the posterior lateral arthrodesis.  We then obtained hemostasis using bipolar electrocautery. We irrigated the wound out with bacitracin solution. We inspected the thecal sac and nerve roots and noted they were well decompressed. We then removed the retractor. We placed a medium Hemovac drain in the epidural space and tunneled out through separate stab wound. We reapproximated patient's thoracolumbar fascia with interrupted #1 Vicryl suture. We reapproximated patient's subcutaneous tissue with interrupted 2-0 Vicryl suture. The reapproximated patient's skin with Steri-Strips and benzoin. The wound was then coated with bacitracin ointment. A sterile dressing was applied. The drapes were removed. The patient was subsequently returned to the supine position where they were extubated by the anesthesia team. He was  then transported to the post anesthesia care unit in stable condition. All sponge instrument and needle counts were reportedly correct at the end of this case.

## 2015-03-04 NOTE — Progress Notes (Signed)
Patient requested for foley to be removed. Requested completed. Patient is DTV at 0130 AM. Will continue to monitor.   Capucine Tryon, RN.

## 2015-03-04 NOTE — Progress Notes (Signed)
Dr. Hart Rochester made aware that pt drank about 8 oz of water this morning at 0315, no new orders received.

## 2015-03-04 NOTE — H&P (Signed)
Subjective: The patient is a 69 year old white female who has complained of back and right greater than left leg pain consistent with neurogenic claudication. She has failed nonsurgical management and was worked up with a lumbar MRI and lumbar x-rays. This demonstrated multilevel degenerative changes most prominent at L4-5. I discussed the various treatment options with the patient including surgery. She has decided to proceed with an L4-5 decompression, instrumentation, and fusion.   Past Medical History  Diagnosis Date  . Hypertension   . Anxiety   . Psoriasis     plaque psoriasis    Past Surgical History  Procedure Laterality Date  . Joint replacement  2006    L KNEE    Allergies  Allergen Reactions  . Tetracyclines & Related Hives    History  Substance Use Topics  . Smoking status: Former Smoker    Types: Cigarettes  . Smokeless tobacco: Never Used  . Alcohol Use: Not on file    History reviewed. No pertinent family history. Prior to Admission medications   Medication Sig Start Date End Date Taking? Authorizing Provider  Adalimumab (HUMIRA PEN) 40 MG/0.8ML PNKT Inject 40 mg into the skin every 14 (fourteen) days. Last injection July 26/2016   Yes Historical Provider, MD  amLODipine-benazepril (LOTREL) 5-10 MG per capsule Take 1 capsule by mouth daily. 02/02/15  Yes Historical Provider, MD  Calcium Carb-Cholecalciferol (CALCIUM + D3 PO) Take 2 tablets by mouth daily. Vitamin D 2500 I.U., calcium 100 mg (per tablet)   Yes Historical Provider, MD  clorazepate (TRANXENE) 7.5 MG tablet Take 7.5 mg by mouth 3 (three) times daily as needed for anxiety. anxiety 02/07/15  Yes Historical Provider, MD  Melatonin 3 MG TABS Take 3 mg by mouth at bedtime as needed (sleep).   Yes Historical Provider, MD  OVER THE COUNTER MEDICATION Take 1,600 mg by mouth daily. Cholestra Care #2 800 mg tablets   Yes Historical Provider, MD  OVER THE COUNTER MEDICATION Take 1 tablet by mouth 2 (two) times  daily. Retinavites   Yes Historical Provider, MD  PRESCRIPTION MEDICATION Take 0.5 Troches by mouth 2 (two) times daily. (Estradiol/estriol 50/50 0.5 mg; progesterone 125 mg; testosterone 1.25 mg) per troche compounded by [employer], [place]. [place], [place]   Yes Historical Provider, MD  triamcinolone cream (KENALOG) 0.1 % Apply 1 application topically 2 (two) times daily as needed (psoriasis).  12/18/14  Yes Historical Provider, MD  venlafaxine (EFFEXOR) 75 MG tablet Take 75 mg by mouth daily. 02/02/15  Yes Historical Provider, MD     Review of Systems  Positive ROS: As above  All other systems have been reviewed and were otherwise negative with the exception of those mentioned in the HPI and as above.  Objective: Vital signs in last 24 hours: Temp:  [97.7 F (36.5 C)] 97.7 F (36.5 C) (08/04 0636) Pulse Rate:  [70] 70 (08/04 0636) Resp:  [20] 20 (08/04 0636) BP: (138)/(86) 138/86 mmHg (08/04 0636) SpO2:  [97 %] 97 % (08/04 0636) Weight:  [78.926 kg (174 lb)] 78.926 kg (174 lb) (08/04 0636)  General Appearance: Alert, cooperative, no distress, Head: Normocephalic, without obvious abnormality, atraumatic Eyes: PERRL, conjunctiva/corneas clear, EOM's intact,    Ears: Normal  Throat: Normal  Neck: Supple, symmetrical, trachea midline, no adenopathy; thyroid: No enlargement/tenderness/nodules; no carotid bruit or JVD Back: Symmetric, no curvature, ROM normal, no CVA tenderness Lungs: Clear to auscultation bilaterally, respirations unlabored Heart: Regular rate and rhythm, no murmur, rub or gallop Abdomen: Soft, non-tender,, no  masses, no organomegaly Extremities: Extremities normal, atraumatic, no cyanosis or edema Pulses: 2+ and symmetric all extremities Skin: Skin color, texture, turgor normal, no rashes or lesions  NEUROLOGIC:   Mental status: alert and oriented, no aphasia, good attention span, Fund of knowledge/ memory ok Motor Exam - grossly normal Sensory Exam - grossly  normal Reflexes:  Coordination - grossly normal Gait - grossly normal Balance - grossly normal Cranial Nerves: I: smell Not tested  II: visual acuity  OS: Normal  OD: Normal   II: visual fields Full to confrontation  II: pupils Equal, round, reactive to light  III,VII: ptosis None  III,IV,VI: extraocular muscles  Full ROM  V: mastication Normal  V: facial light touch sensation  Normal  V,VII: corneal reflex  Present  VII: facial muscle function - upper  Normal  VII: facial muscle function - lower Normal  VIII: hearing Not tested  IX: soft palate elevation  Normal  IX,X: gag reflex Present  XI: trapezius strength  5/5  XI: sternocleidomastoid strength 5/5  XI: neck flexion strength  5/5  XII: tongue strength  Normal    Data Review Lab Results  Component Value Date   WBC 6.3 03/01/2015   HGB 13.5 03/01/2015   HCT 41.2 03/01/2015   MCV 95.6 03/01/2015   PLT 251 03/01/2015   Lab Results  Component Value Date   NA 137 03/01/2015   K 3.8 03/01/2015   CL 100* 03/01/2015   CO2 30 03/01/2015   BUN 12 03/01/2015   CREATININE 0.79 03/01/2015   GLUCOSE 93 03/01/2015   No results found for: INR, PROTIME  Assessment/Plan: L4-5 spondylolisthesis, spinal stenosis, lumbago, lumbar radiculopathy, neurogenic claudication: I have discussed situation with the patient. I have reviewed her imaging studies with her and pointed out the abnormalities. We have discussed the various treatment options including surgery. I have described the surgical treatment option of an L4-5 decompression, instrumentation, and fusion. I have shown her surgical models. We have discussed the risks, benefits, alternatives, and likelihood of achieving our goals with surgery. I have answered all the patient's questions. She has decided to proceed with surgery.   Dorethia Jeanmarie D 03/04/2015 9:34 AM

## 2015-03-04 NOTE — Anesthesia Postprocedure Evaluation (Signed)
  Anesthesia Post-op Note  Patient: Hannah Henson  Procedure(s) Performed: Procedure(s): POSTERIOR LUMBAR INTERBODY FUSION LUMBAR FOUR-FIVE WITH INTERBODY PROTHESIS WITH POSTERIOR LATERAL ARTHRODESIS AND POSTERIOR NONSEGMENTAL INSTRUMENTATION (N/A)  Patient Location: PACU  Anesthesia Type:General  Level of Consciousness: awake, alert  and oriented  Airway and Oxygen Therapy: Patient Spontanous Breathing  Post-op Pain: moderate, consistent with chronic pain  Post-op Assessment: Post-op Vital signs reviewed   LLE Sensation: No numbness   RLE Sensation: No numbness      Post-op Vital Signs: Reviewed  Last Vitals:  Filed Vitals:   03/04/15 1430  BP: 118/77  Pulse: 81  Temp:   Resp: 19    Complications: No apparent anesthesia complications

## 2015-03-04 NOTE — Progress Notes (Signed)
Extreme pain R Hip radiating to knee.  Ice applied to lower back & hip, administered 2 percocet without relie fafter 30 min.  Gave Morphine  IV & Valium  in order to get pain under control, positioned on left side with back supported.  Discussed relaxation techniques with patient & spouse.  Will continue to monitor.

## 2015-03-04 NOTE — Anesthesia Preprocedure Evaluation (Addendum)
Anesthesia Evaluation  Patient identified by MRN, date of birth, ID band Patient awake    Reviewed: Allergy & Precautions, NPO status , Patient's Chart, lab work & pertinent test results  Airway Mallampati: II       Dental  (+) Teeth Intact   Pulmonary former smoker,  breath sounds clear to auscultation        Cardiovascular Exercise Tolerance: Good hypertension, Rhythm:Regular Rate:Normal     Neuro/Psych Anxiety negative neurological ROS     GI/Hepatic negative GI ROS, Neg liver ROS,   Endo/Other  negative endocrine ROS  Renal/GU negative Renal ROS  negative genitourinary   Musculoskeletal   Abdominal   Peds negative pediatric ROS (+)  Hematology Pt has history of humira use, slight increase in bleeding risk noted.    Anesthesia Other Findings   Reproductive/Obstetrics                          Lab Results  Component Value Date   WBC 6.3 03/01/2015   HGB 13.5 03/01/2015   HCT 41.2 03/01/2015   MCV 95.6 03/01/2015   PLT 251 03/01/2015   Lab Results  Component Value Date   CREATININE 0.79 03/01/2015   BUN 12 03/01/2015   NA 137 03/01/2015   K 3.8 03/01/2015   CL 100* 03/01/2015   CO2 30 03/01/2015   EKG: normal EKG, normal sinus rhythm.  Anesthesia Physical Anesthesia Plan  ASA: II  Anesthesia Plan: General   Post-op Pain Management:    Induction: Intravenous  Airway Management Planned: Oral ETT  Additional Equipment:   Intra-op Plan:   Post-operative Plan: Extubation in OR  Informed Consent: I have reviewed the patients History and Physical, chart, labs and discussed the procedure including the risks, benefits and alternatives for the proposed anesthesia with the patient or authorized representative who has indicated his/her understanding and acceptance.   Dental advisory given  Plan Discussed with: CRNA  Anesthesia Plan Comments: (Anesthetic plan discussed in  detail. Associated risk discussed including but not limited to life threatening cardiovascular, pulmonary events and dental damage. The postoperative pain management and antiemetic plan discussed with patient. All questions answered in detail. Patient is in agreement.    Pt stopped humira approx 1.5 weeks ago, slight increase in bleeding risk. )      Anesthesia Quick Evaluation

## 2015-03-05 LAB — CBC
HCT: 34.6 % — ABNORMAL LOW (ref 36.0–46.0)
Hemoglobin: 11.2 g/dL — ABNORMAL LOW (ref 12.0–15.0)
MCH: 31.2 pg (ref 26.0–34.0)
MCHC: 32.4 g/dL (ref 30.0–36.0)
MCV: 96.4 fL (ref 78.0–100.0)
Platelets: 208 10*3/uL (ref 150–400)
RBC: 3.59 MIL/uL — AB (ref 3.87–5.11)
RDW: 12.4 % (ref 11.5–15.5)
WBC: 7 10*3/uL (ref 4.0–10.5)

## 2015-03-05 LAB — BASIC METABOLIC PANEL
Anion gap: 7 (ref 5–15)
BUN: 9 mg/dL (ref 6–20)
CO2: 31 mmol/L (ref 22–32)
Calcium: 8.1 mg/dL — ABNORMAL LOW (ref 8.9–10.3)
Chloride: 99 mmol/L — ABNORMAL LOW (ref 101–111)
Creatinine, Ser: 0.87 mg/dL (ref 0.44–1.00)
GFR calc Af Amer: >60 mL/min (ref >60)
Glucose, Bld: 107 mg/dL — ABNORMAL HIGH (ref 65–99)
POTASSIUM: 3.5 mmol/L (ref 3.5–5.1)
Sodium: 137 mmol/L (ref 135–145)

## 2015-03-05 MED ORDER — GABAPENTIN 600 MG PO TABS
600.0000 mg | ORAL_TABLET | Freq: Three times a day (TID) | ORAL | Status: DC
  Administered 2015-03-05 – 2015-03-07 (×7): 600 mg via ORAL
  Filled 2015-03-05 (×7): qty 1

## 2015-03-05 MED ORDER — DEXAMETHASONE SODIUM PHOSPHATE 4 MG/ML IJ SOLN
8.0000 mg | Freq: Four times a day (QID) | INTRAMUSCULAR | Status: AC
  Administered 2015-03-05 – 2015-03-06 (×6): 8 mg via INTRAVENOUS
  Filled 2015-03-05 (×6): qty 2

## 2015-03-05 MED FILL — Heparin Sodium (Porcine) Inj 1000 Unit/ML: INTRAMUSCULAR | Qty: 30 | Status: AC

## 2015-03-05 MED FILL — Sodium Chloride IV Soln 0.9%: INTRAVENOUS | Qty: 1000 | Status: AC

## 2015-03-05 NOTE — Progress Notes (Signed)
Patient ID: Hannah Henson, female   DOB: [birth year], 69 y.o.   MRN: [id number] Subjective:  The patient is alert and pleasant. She complains of "right sciatica".  Objective: Vital signs in last 24 hours: Temp:  [97 F (36.1 C)-98.3 F (36.8 C)] 98.3 F (36.8 C) (08/05 0556) Pulse Rate:  [64-104] 67 (08/05 0556) Resp:  [14-22] 17 (08/05 0556) BP: (99-136)/(49-86) 134/72 mmHg (08/05 0556) SpO2:  [91 %-99 %] 91 % (08/05 0556)  Intake/Output from previous day: 08/04 0701 - 08/05 0700 In: 1790 [P.O.:240; I.V.:1500; IV Piggyback:50] Out: 1225 [Urine:680; Drains:345; Blood:200] Intake/Output this shift:    Physical exam the patient is alert and oriented. Her strength is normal in her lower extremities except she has a right foot drop.  Lab Results:  Recent Labs  03/05/15 0510  WBC 7.0  HGB 11.2*  HCT 34.6*  PLT 208   BMET  Recent Labs  03/05/15 0510  NA 137  K 3.5  CL 99*  CO2 31  GLUCOSE 107*  BUN 9  CREATININE 0.87  CALCIUM 8.1*    Studies/Results: Dg Lumbar Spine 2-3 Views  03/04/2015   CLINICAL DATA:  L4-5 laminectomy and fusion.  Intraoperative films.  EXAM: DG C-ARM 61-120 MIN; LUMBAR SPINE - 2-3 VIEW  COMPARISON:  Plain films lumbar spine 11/16/2014.  FINDINGS: Two fluoroscopic intraoperative spot views demonstrate pedicle screws interbody spacer in place at L4-5. 0.8 cm anterolisthesis L4 on L5 is unchanged.  IMPRESSION: L4-5 PLIF in progress.   Electronically Signed   By: Drusilla Kanner M.Henson.   On: 03/04/2015 13:57   Dg Lumbar Spine 1 View  03/04/2015   CLINICAL DATA:  Posterior fusion.  EXAM: LUMBAR SPINE - 1 VIEW  COMPARISON:  Intraoperative images 8 10/2014. MRI lumbar spine 04/18/2007 .  FINDINGS: Lumbar spine numbered as per prior exam and per prior MRI of 04/18/2007. Metallic marker noted at L5-S1. Mild anterolisthesis L5-S1 again noted.  IMPRESSION: Metallic marker noted at L5-S1 level posteriorly.   Electronically Signed   By: Maisie Fus  Register   On:  03/04/2015 13:16   Dg Spine Portable 1 View  03/04/2015   CLINICAL DATA:  Lumbar spine surgery.  EXAM: PORTABLE SPINE - 1 VIEW  COMPARISON:  Lumbar spine radiographs 11/16/2014. Lumbar spine MRI 10/26/2014 and 04/18/2007.  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 The vertebrae will be numbered as on the 04/18/2007 MRI, with S1 being considered lumbarized. There is slight anterolisthesis of L4 on L5 with more pronounced anterolisthesis of L5 on S1. The tip of a surgical probe projects posterior to the L5-S1 disc space, overlying the L5 spinous process.  IMPRESSION: Intraoperative image for localization purposes as above.  These results were called by telephone at the time of interpretation on 03/04/2015 at 10:56 am to Dr. Tressie Stalker , who verbally acknowledged these results.   Electronically Signed   By: Sebastian Ache   On: 03/04/2015 11:00   Dg C-arm 1-60 Min  03/04/2015   CLINICAL DATA:  L4-5 laminectomy and fusion.  Intraoperative films.  EXAM: DG C-ARM 61-120 MIN; LUMBAR SPINE - 2-3 VIEW  COMPARISON:  Plain films lumbar spine 11/16/2014.  FINDINGS: Two fluoroscopic intraoperative spot views demonstrate pedicle screws interbody spacer in place at L4-5. 0.8 cm anterolisthesis L4 on L5 is unchanged.  IMPRESSION: L4-5 PLIF in progress.   Electronically Signed   By: Drusilla Kanner M.Henson.   On: 03/04/2015 13:57    Assessment/Plan: Postop day #1: The patient had right L5 weakness preoperatively  which is worse now. I suspect this is from nerve root irritation. I'll start her on steroid-induced and Neurontin. We will mobilize her with PT.  LOS: 1 day     Hannah Henson 03/05/2015, 7:58 AM

## 2015-03-05 NOTE — Evaluation (Signed)
Physical Therapy Evaluation Patient Details Name: SUMIE REMSEN MRN: [id number] DOB: [birth year] Today's Date: 03/05/2015   History of Present Illness  Patient is a 69 yo female admitted 03/04/15 with back and RLE pain.  Patient s/p L4-5 PLIF.  PMH:  HTN, Anxiety, Lt TKA  Clinical Impression  Patient presents with problems listed below.  Will benefit from acute PT to maximize functional mobility prior to return home with husband.    Follow Up Recommendations Supervision/Assistance - 24 hour;No PT follow up (May benefit from OP PT at later time)    Equipment Recommendations  None recommended by PT    Recommendations for Other Services       Precautions / Restrictions Precautions Precautions: Back;Fall Precaution Booklet Issued: Yes (comment) Precaution Comments: Reviewed precautions with patient Required Braces or Orthoses: Spinal Brace Spinal Brace: Lumbar corset;Applied in sitting position Restrictions Weight Bearing Restrictions: No      Mobility  Bed Mobility Overal bed mobility: Needs Assistance Bed Mobility: Rolling;Sidelying to Sit Rolling: Min assist Sidelying to sit: Mod assist       General bed mobility comments: Verbal cues for log rolling technique.  Assist to raise trunk to upright sitting position.  Able to maintain balance once upright.  Mod assist to don brace.  Transfers Overall transfer level: Needs assistance Equipment used: Rolling walker (2 wheeled) Transfers: Sit to/from Stand Sit to Stand: Min assist         General transfer comment: Verbal cues for hand placement and technique.  Assist to rise to standing and for balance.    Ambulation/Gait Ambulation/Gait assistance: Min assist Ambulation Distance (Feet): 20 Feet Assistive device: Rolling walker (2 wheeled) Gait Pattern/deviations: Step-through pattern;Decreased step length - right;Decreased step length - left;Decreased stride length;Decreased weight shift to right;Decreased dorsiflexion -  right;Steppage Gait velocity: Decreased Gait velocity interpretation: Below normal speed for age/gender General Gait Details: Verbal cues for safe use of RW.  Patient with decreased DF on Rt LE, with steppage gait to compensate.  Decreased weight shift to Rt due to pain.  Fatigued quickly.  Stairs            Wheelchair Mobility    Modified Rankin (Stroke Patients Only)       Balance Overall balance assessment: Needs assistance         Standing balance support: Single extremity supported Standing balance-Leahy Scale: Poor                               Pertinent Vitals/Pain Pain Assessment: 0-10 Pain Score: 3  Pain Location: Back and RLE Pain Descriptors / Indicators: Aching;Sore Pain Intervention(s): Monitored during session;Premedicated before session;Repositioned    Home Living Family/patient expects to be discharged to:: Private residence Living Arrangements: Spouse/significant other Available Help at Discharge: Family;Available PRN/intermittently (Husband works part-time job) Type of Home: [employer] Access: Stairs to enter Entrance Stairs-Rails: Doctor, general practice of Steps: 3 Home Layout: Two level;Bed/bath upstairs Home Equipment: Environmental consultant - 2 wheels;Bedside commode      Prior Function Level of Independence: Independent         Comments: Works part-time job.  Drives     Hand Dominance        Extremity/Trunk Assessment   Upper Extremity Assessment: Overall WFL for tasks assessed (Patient reports bil 4th and 5th fingers are numb)           Lower Extremity Assessment: Generalized weakness;RLE deficits/detail RLE Deficits / Details: Foot drop  on Rt       Communication   Communication: No difficulties  Cognition Arousal/Alertness: Lethargic;Suspect due to medications Behavior During Therapy: [facility] for tasks assessed/performed Overall Cognitive Status: Within Functional Limits for tasks assessed                       General Comments      Exercises        Assessment/Plan    PT Assessment Patient needs continued PT services  PT Diagnosis Difficulty walking;Acute pain   PT Problem List Decreased strength;Decreased activity tolerance;Decreased balance;Decreased mobility;Decreased knowledge of use of DME;Decreased knowledge of precautions;Pain  PT Treatment Interventions DME instruction;Gait training;Stair training;Functional mobility training;Therapeutic activities;Patient/family education   PT Goals (Current goals can be found in the Care Plan section) Acute Rehab PT Goals Patient Stated Goal: To return home PT Goal Formulation: With patient Time For Goal Achievement: 03/12/15 Potential to Achieve Goals: Good    Frequency Min 5X/week   Barriers to discharge Decreased caregiver support Patient reports husband works part-time job.  Encouraged patient to have 24 hour assist initially at discharge for assist/safety.    Co-evaluation               End of Session Equipment Utilized During Treatment: Back brace;Gait belt Activity Tolerance: Patient limited by pain;Patient limited by lethargy;Patient limited by fatigue Patient left: in chair;with call bell/phone within reach;with chair alarm set Nurse Communication: Mobility status         Time: 1350-1418 PT Time Calculation (min) (ACUTE ONLY): 28 min   Charges:   PT Evaluation $Initial PT Evaluation Tier I: 1 Procedure PT Treatments $Gait Training: 8-22 mins   PT G Codes:        Vena Austria 06-Mar-2015, 4:08 PM Durenda Hurt. Renaldo Fiddler, [facility] Acute Rehab Services Pager [phone]

## 2015-03-06 NOTE — Progress Notes (Signed)
Physical Therapy Treatment Patient Details Name: Hannah Henson MRN: [id number] DOB: [birth year] Today's Date: 03/06/2015    History of Present Illness Patient is a 69 yo female admitted 03/04/15 with back and RLE pain.  Patient s/p L4-5 PLIF.  PMH:  HTN, Anxiety, Lt TKA    PT Comments    Patient seen with spouse and sister present.  Patient is impulsive, elevated fall risk, decreased safety insight, per spouse is similar at baseline function.  Noted Rt foot drop and gait deviations of longstanding nature, discussion with patient and family about future plan for investigation including recovery from this surgery and follow up with neurology.  Min Guard overall physical assist for all bed mobility, gait and stairs.  Patient and spouse comfortable with management of brace and overall care.  Patient will benefit from additional therapy with transition home, then later as indicated for foot drop and gait pattern correction.  Follow Up Recommendations  Supervision/Assistance - 24 hour;Home health PT (May benefit from OP PT at later time.)     Equipment Recommendations  Rolling walker with 5" wheels    Recommendations for Other Services       Precautions / Restrictions Precautions Precautions: Back;Fall Precaution Booklet Issued: Yes (comment) Precaution Comments: Reviewed precautions with patient Required Braces or Orthoses: Spinal Brace Spinal Brace: Lumbar corset;Applied in sitting position Restrictions Weight Bearing Restrictions: No    Mobility  Bed Mobility Overal bed mobility: Needs Assistance Bed Mobility: Rolling;Sidelying to Sit Rolling: Supervision Sidelying to sit: Min guard       General bed mobility comments: Verbal cues for log rolling technique.  Bed flat, no rail.  Mod assist (cues) to don brace.  Transfers Overall transfer level: Needs assistance Equipment used: Rolling walker (2 wheeled)   Sit to Stand: Min guard         General transfer comment: Verbal  cues for safety, impulsiveness, hand placement and technique.    Ambulation/Gait Ambulation/Gait assistance: Supervision;Min guard Ambulation Distance (Feet): 150 Feet Assistive device: Rolling walker (2 wheeled) Gait Pattern/deviations: Trendelenburg;Steppage Gait velocity: Decreased   General Gait Details: Verbal cues for safe use of RW.  Patient with R foot drop with steppage gait, trunk lean to L as well, longstanding gait pattern per spouse.   Stairs Stairs: Yes Stairs assistance: Min assist Stair Management: One rail Left Number of Stairs: 24 General stair comments: Cues for safety and technique, patient easily distracted.  Wheelchair Mobility    Modified Rankin (Stroke Patients Only)       Balance Overall balance assessment: Needs assistance   Sitting balance-Leahy Scale: Good       Standing balance-Leahy Scale: Fair                      Cognition Arousal/Alertness: Awake/alert Behavior During Therapy: Restless;Impulsive;WFL for tasks assessed/performed Overall Cognitive Status: Within Functional Limits for tasks assessed                      Exercises General Exercises - Lower Extremity Ankle Circles/Pumps: AROM;AAROM;Both;10 reps;Seated Long Arc Quad: AROM;Both;10 reps;Seated Other Exercises Other Exercises: Right LE ankle alphabet AAROM.    General Comments        Pertinent Vitals/Pain Pain Assessment: 0-10 Pain Score: 2  Pain Location: R LE Pain Descriptors / Indicators: Aching;Sore Pain Intervention(s): Monitored during session;Repositioned    Home Living  Prior Function            PT Goals (current goals can now be found in the care plan section) Acute Rehab PT Goals Patient Stated Goal: To return home PT Goal Formulation: With patient Time For Goal Achievement: 03/12/15 Potential to Achieve Goals: Good Progress towards PT goals: Progressing toward goals    Frequency  Min 5X/week     PT Plan Current plan remains appropriate    Co-evaluation             End of Session Equipment Utilized During Treatment: Back brace;Gait belt Activity Tolerance: Patient tolerated treatment well Patient left: in chair;with call bell/phone within reach;with chair alarm set;with family/visitor present     Time: 1355-1455 PT Time Calculation (min) (ACUTE ONLY): 60 min  Charges:  $Gait Training: 8-22 mins $Therapeutic Exercise: 8-22 mins $Therapeutic Activity: 23-37 mins                    G Codes:      Carlette Palmatier L 03-20-15, 4:55 PM

## 2015-03-06 NOTE — Progress Notes (Signed)
Filed Vitals:   03/05/15 2030 03/05/15 2103 03/06/15 0100 03/06/15 0508  BP:  126/74 125/73 129/73  Pulse:  69 71 66  Temp:  98.5 F (36.9 C) 98.6 F (37 C) 98.2 F (36.8 C)  TempSrc:  Oral Oral Oral  Resp: Weight:      SpO2:  93% 95% 94%    CBC  Recent Labs  03/05/15 0510  WBC 7.0  HGB 11.2*  HCT 34.6*  PLT 208   BMET  Recent Labs  03/05/15 0510  NA 137  K 3.5  CL 99*  CO2 31  GLUCOSE 107*  BUN 9  CREATININE 0.87  CALCIUM 8.1*    Patient more comfortable, but has ambulated only in the room, has not ambulated in the halls. Spoke with patient and her husband about working with nursing staff to progress to ambulation in the halls, at least 3-4 times a day.  Plan: Continue to progress through postoperative recovery.  Hewitt Shorts, MD 03/06/2015, 8:51 AM

## 2015-03-06 NOTE — Care Management Note (Signed)
Case Management Note  Patient Details  Name: Hannah Henson MRN: [id number] Date of Birth: 1946/01/24  Subjective/Objective:                    Action/Plan:   Expected Discharge Date:                  Expected Discharge Plan:     In-House Referral:     Discharge planning Services     Post Acute Care Choice:    Choice offered to:     DME Arranged:    DME Agency:     HH Arranged:    HH Agency:     Status of Service:     Medicare Important Message Given:   yes Date Medicare IM Given:    03/06/15 Medicare IM give by:   Meryl Crutch, RN, BSN Date Additional Medicare IM Given:    Additional Medicare Important Message give by:     If discussed at Long Length of Stay Meetings, dates discussed:    Additional Comments:  Isaias Cowman, RN 03/06/2015, 12:39 PM

## 2015-03-07 MED ORDER — HYDROCODONE-ACETAMINOPHEN 5-325 MG PO TABS: 1.0000 | ORAL_TABLET | ORAL | Status: DC | PRN

## 2015-03-07 MED ORDER — GABAPENTIN 600 MG PO TABS: 600.0000 mg | ORAL_TABLET | Freq: Three times a day (TID) | ORAL | Status: AC

## 2015-03-07 MED ORDER — DIAZEPAM 5 MG PO TABS: 5.0000 mg | ORAL_TABLET | Freq: Four times a day (QID) | ORAL | Status: DC | PRN

## 2015-03-07 NOTE — Care Management Note (Signed)
Case Management Note  Patient Details  Name: Hannah Henson MRN: [id number] Date of Birth: Jul 23, 1946  Subjective/Objective:                   POSTERIOR LUMBAR INTERBODY FUSION LUMBAR FOUR-FIVE WITH INTERBODY PROTHESIS WITH POSTERIOR LATERAL ARTHRODESIS AND POSTERIOR NONSEGMENTAL INSTRUMENTATION (N/A) Action/Plan: Discharge planning  Expected Discharge Date:                  Expected Discharge Plan:  Home/Self Care  In-House Referral:     Discharge planning Services  CM Consult  Post Acute Care Choice:  NA Choice offered to:  NA  DME Arranged:  Walker rolling DME Agency:  [facility]  HH Arranged:    HH Agency:     Status of Service:  Completed, signed off  Medicare Important Message Given:    Date Medicare IM Given:    Medicare IM give by:    Date Additional Medicare IM Given:    Additional Medicare Important Message give by:     If discussed at Long Length of Stay Meetings, dates discussed:    Additional Comments: CM received call from RN requesting rolling walker.  Cm called [facility] DME rep, Fayrene Fearing to please deliver the walker to room so pt can discharge home. Yves Dill, RN 03/07/2015, 9:11 AM

## 2015-03-07 NOTE — Progress Notes (Signed)
Patient being discharged home IV removed, she is alert and pain is under control. Husband is driving her prescriptions have been given to her and discharge summary was reviewed and front wheel walker supplied.

## 2015-03-07 NOTE — Discharge Summary (Signed)
Physician Discharge Summary  Patient ID: Hannah Henson MRN: [id number] DOB/AGE: [birth year] 69 y.o.  Admit date: 03/04/2015 Discharge date: 03/07/2015  Admission Diagnoses:  Lumbar spondylolisthesis, lumbar degenerative disc disease, lumbar stenosis  Discharge Diagnoses:  Lumbar spinal listhesis, lumbar degenerative disc disease, lumbar stenosis Active Problems:   Spondylolisthesis of lumbar region   Discharged Condition: good  Hospital Course: Patient admitted by Dr. Delma Officer, underwent an L4-5 lumbar decompression and fusion. Postoperatively she's had weakness in the right foot, with a right foot drop. She's been ambulating in the halls, gradually increasing distances. She has worked with physical therapy, was able to go up and down 16 steps. She is asking be discharged to home. We changed her dressing, and re-Steri-Stripped her wound as needed. She and her husband have been given instructions regarding wound care and activities. She is to return for follow-up with Dr. Lovell Sheehan in 2-3 weeks. We have given her prescription for outpatient physical therapy with instructions that she's undergone a lumbar fusion and this prescriptions for lumbar reconditioning and therapy for right foot drop, 2-3 times per week for 3-4 weeks. The patient and her husband going to make arrangements for the physical therapy at a center near her home in [place].  Discharge Exam: Blood pressure 114/69, pulse 73, temperature 97.6 F (36.4 C), temperature source Oral, resp. rate 19, weight 78.926 kg (174 lb), SpO2 98 %.  Disposition:  Home      Medication List    TAKE these medications        amLODipine-benazepril 5-10 MG per capsule  Commonly known as:  LOTREL  Take 1 capsule by mouth daily.     CALCIUM + D3 PO  Take 2 tablets by mouth daily. Vitamin D 2500 I.U., calcium 100 mg (per tablet)     clorazepate 7.5 MG tablet  Commonly known as:  TRANXENE  Take 7.5 mg by mouth 3 (three)  times daily as needed for anxiety. anxiety     diazepam 5 MG tablet  Commonly known as:  VALIUM  Take 1 tablet (5 mg total) by mouth every 6 (six) hours as needed for muscle spasms.     gabapentin 600 MG tablet  Commonly known as:  NEURONTIN  Take 1 tablet (600 mg total) by mouth 3 (three) times daily.     HUMIRA PEN 40 MG/0.8ML Pnkt  Generic drug:  Adalimumab  Inject 40 mg into the skin every 14 (fourteen) days. Last injection July 26/2016     HYDROcodone-acetaminophen 5-325 MG per tablet  Commonly known as:  NORCO/VICODIN  Take 1-2 tablets by mouth every 4 (four) hours as needed for moderate pain.     Melatonin 3 MG Tabs  Take 3 mg by mouth at bedtime as needed (sleep).     OVER THE COUNTER MEDICATION  Take 1,600 mg by mouth daily. Cholestra Care #2 800 mg tablets     OVER THE COUNTER MEDICATION  Take 1 tablet by mouth 2 (two) times daily. Retinavites     PRESCRIPTION MEDICATION  Take 0.5 Troches by mouth 2 (two) times daily. (Estradiol/estriol 50/50 0.5 mg; progesterone 125 mg; testosterone 1.25 mg) per troche compounded by [employer], [place]. Pleasant, [place]     triamcinolone cream 0.1 %  Commonly known as:  KENALOG  Apply 1 application topically 2 (two) times daily as needed (psoriasis).     venlafaxine 75 MG tablet  Commonly known as:  EFFEXOR  Take 75 mg by mouth daily.  SignedHewitt Shorts 03/07/2015, 8:58 AM

## 2015-06-03 ENCOUNTER — Other Ambulatory Visit: Payer: Self-pay | Admitting: Neurosurgery

## 2015-06-03 DIAGNOSIS — M21371 Foot drop, right foot: Secondary | ICD-10-CM

## 2015-06-15 ENCOUNTER — Other Ambulatory Visit: Payer: Medicare Other

## 2015-06-18 ENCOUNTER — Other Ambulatory Visit: Payer: Medicare Other

## 2015-09-28 ENCOUNTER — Other Ambulatory Visit: Payer: Self-pay | Admitting: Neurosurgery

## 2015-11-16 ENCOUNTER — Encounter (HOSPITAL_COMMUNITY): Payer: Self-pay | Admitting: *Deleted

## 2015-11-16 NOTE — Anesthesia Preprocedure Evaluation (Addendum)
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 L4-5 laminectomy with PLIF 03/04/15  Reviewed: Allergy & Precautions, NPO status , Patient's Chart, lab work & pertinent test results  History of Anesthesia Complications (+) Family history of anesthesia reaction and history of anesthetic complications  Airway Mallampati: II  TM Distance: >3 FB Neck ROM: Full    Dental  (+) Teeth Intact, Dental Advisory Given, Caps   Pulmonary former smoker,    Pulmonary exam normal breath sounds clear to auscultation       Cardiovascular hypertension, Pt. on medications Normal cardiovascular exam Rhythm:Regular Rate:Normal  EKG 03/01/15 NSR, possible inferior infarct (age undetermined). No comparison EKGs available   Neuro/Psych PSYCHIATRIC DISORDERS Anxiety Right foot drop    GI/Hepatic negative GI ROS, Neg liver ROS,   Endo/Other  negative endocrine ROS  Renal/GU negative Renal ROS     Musculoskeletal negative musculoskeletal ROS (+)   Abdominal   Peds  Hematology negative hematology ROS (+)   Anesthesia Other Findings Day of surgery medications reviewed with the patient.  Reproductive/Obstetrics                        Anesthesia Physical Anesthesia Plan  ASA: II  Anesthesia Plan: General   Post-op Pain Management:    Induction: Intravenous  Airway Management Planned: Oral ETT  Additional Equipment:   Intra-op Plan:   Post-operative Plan: Extubation in OR  Informed Consent: I have reviewed the patients History and Physical, chart, labs and discussed the procedure including the risks, benefits and alternatives for the proposed anesthesia with the patient or authorized representative who has indicated his/her understanding and acceptance.   Dental advisory given  Plan  Discussed with: CRNA  Anesthesia Plan Comments: (Risks/benefits of general anesthesia discussed with patient including risk of damage to teeth, lips, gum, and tongue, nausea/vomiting, allergic reactions to medications, and the possibility of heart attack, stroke and death.  All patient questions answered.  Patient wishes to proceed.)        Anesthesia Quick Evaluation                                   Anesthesia Evaluation  Patient identified by MRN, date of birth, ID band Patient awake    Reviewed: Allergy & Precautions, NPO status , Patient's Chart, lab work & pertinent test results  Airway Mallampati: II       Dental  (+) Teeth Intact   Pulmonary former smoker,  breath sounds clear to auscultation        Cardiovascular Exercise Tolerance: Good hypertension, Rhythm:Regular Rate:Normal     Neuro/Psych Anxiety negative neurological ROS     GI/Hepatic negative GI ROS, Neg liver ROS,   Endo/Other  negative endocrine ROS  Renal/GU negative Renal ROS  negative genitourinary   Musculoskeletal   Abdominal   Peds negative pediatric ROS (+)  Hematology Pt has history of humira use, slight increase in bleeding risk noted.    Anesthesia Other Findings   Reproductive/Obstetrics                          Lab Results  Component Value Date   WBC 7.0 03/05/2015   HGB 11.2* 03/05/2015   HCT 34.6* 03/05/2015   MCV 96.4 03/05/2015   PLT  208 03/05/2015   Lab Results  Component Value Date   CREATININE 0.87 03/05/2015   BUN 9 03/05/2015   NA 137 03/05/2015   K 3.5 03/05/2015   CL 99* 03/05/2015   CO2 31 03/05/2015   EKG: normal EKG, normal sinus rhythm.  Anesthesia Physical Anesthesia Plan  ASA: II  Anesthesia Plan: General   Post-op Pain Management:    Induction: Intravenous  Airway Management Planned: Oral ETT  Additional Equipment:   Intra-op Plan:   Post-operative Plan: Extubation in OR  Informed Consent: I  have reviewed the patients History and Physical, chart, labs and discussed the procedure including the risks, benefits and alternatives for the proposed anesthesia with the patient or authorized representative who has indicated his/her understanding and acceptance.   Dental advisory given  Plan Discussed with: CRNA  Anesthesia Plan Comments: (Anesthetic plan discussed in detail. Associated risk discussed including but not limited to life threatening cardiovascular, pulmonary events and dental damage. The postoperative pain management and antiemetic plan discussed with patient. All questions answered in detail. Patient is in agreement.    Pt stopped humira approx 1.5 weeks ago, slight increase in bleeding risk. )      Anesthesia Quick Evaluation

## 2015-11-17 ENCOUNTER — Encounter (HOSPITAL_COMMUNITY): Payer: Self-pay | Admitting: *Deleted

## 2015-11-17 ENCOUNTER — Encounter (HOSPITAL_COMMUNITY)
Admission: RE | Disposition: A | Discharge status: Home / Self Care | Payer: Self-pay | Source: Ambulatory Visit | Attending: Neurosurgery

## 2015-11-17 ENCOUNTER — Ambulatory Visit (HOSPITAL_COMMUNITY): Payer: Medicare Other | Admitting: Anesthesiology

## 2015-11-17 ENCOUNTER — Observation Stay (HOSPITAL_COMMUNITY)
Admission: RE | Admit: 2015-11-17 | Discharge: 2015-11-17 | Disposition: A | Discharge status: Home / Self Care | Payer: Medicare Other | Source: Ambulatory Visit | Attending: Neurosurgery | Admitting: Neurosurgery

## 2015-11-17 DIAGNOSIS — M21371 Foot drop, right foot: Principal | ICD-10-CM | POA: Insufficient documentation

## 2015-11-17 DIAGNOSIS — M5416 Radiculopathy, lumbar region: Secondary | ICD-10-CM | POA: Diagnosis not present

## 2015-11-17 DIAGNOSIS — I10 Essential (primary) hypertension: Secondary | ICD-10-CM | POA: Diagnosis not present

## 2015-11-17 DIAGNOSIS — F419 Anxiety disorder, unspecified: Secondary | ICD-10-CM | POA: Diagnosis not present

## 2015-11-17 DIAGNOSIS — Z87891 Personal history of nicotine dependence: Secondary | ICD-10-CM | POA: Diagnosis not present

## 2015-11-17 DIAGNOSIS — Z96652 Presence of left artificial knee joint: Secondary | ICD-10-CM | POA: Diagnosis not present

## 2015-11-17 HISTORY — PX: HARDWARE REMOVAL: SHX979

## 2015-11-17 HISTORY — DX: Family history of other specified conditions: Z84.89

## 2015-11-17 HISTORY — DX: Other complications of anesthesia, initial encounter: T88.59XA

## 2015-11-17 HISTORY — DX: Personal history of other medical treatment: Z92.89

## 2015-11-17 HISTORY — DX: Adverse effect of unspecified anesthetic, initial encounter: T41.45XA

## 2015-11-17 HISTORY — DX: Foot drop, right foot: M21.371

## 2015-11-17 HISTORY — DX: Unspecified macular degeneration: H35.30

## 2015-11-17 LAB — BASIC METABOLIC PANEL
Anion gap: 7 (ref 5–15)
BUN: 19 mg/dL (ref 6–20)
CHLORIDE: 103 mmol/L (ref 101–111)
CO2: 27 mmol/L (ref 22–32)
Calcium: 9.1 mg/dL (ref 8.9–10.3)
Creatinine, Ser: 0.81 mg/dL (ref 0.44–1.00)
GFR calc Af Amer: >60 mL/min (ref >60)
GFR calc non Af Amer: >60 mL/min (ref >60)
GLUCOSE: 82 mg/dL (ref 65–99)
POTASSIUM: 3.9 mmol/L (ref 3.5–5.1)
Sodium: 137 mmol/L (ref 135–145)

## 2015-11-17 LAB — CBC
HEMATOCRIT: 38.9 % (ref 36.0–46.0)
HEMOGLOBIN: 12.8 g/dL (ref 12.0–15.0)
MCH: 30.4 pg (ref 26.0–34.0)
MCHC: 32.9 g/dL (ref 30.0–36.0)
MCV: 92.4 fL (ref 78.0–100.0)
Platelets: 259 10*3/uL (ref 150–400)
RBC: 4.21 MIL/uL (ref 3.87–5.11)
RDW: 13.2 % (ref 11.5–15.5)
WBC: 6.7 10*3/uL (ref 4.0–10.5)

## 2015-11-17 LAB — SURGICAL PCR SCREEN
MRSA, PCR: NEGATIVE
Staphylococcus aureus: NEGATIVE

## 2015-11-17 SURGERY — REMOVAL, HARDWARE
Anesthesia: General | Site: Back | Laterality: Right

## 2015-11-17 MED ORDER — FENTANYL CITRATE (PF) 100 MCG/2ML IJ SOLN
INTRAMUSCULAR | Status: DC | PRN
  Administered 2015-11-17 (×2): 50 ug via INTRAVENOUS

## 2015-11-17 MED ORDER — ALUM & MAG HYDROXIDE-SIMETH 200-200-20 MG/5ML PO SUSP: 30.0000 mL | Freq: Four times a day (QID) | ORAL | Status: DC | PRN

## 2015-11-17 MED ORDER — BENAZEPRIL HCL 10 MG PO TABS
10.0000 mg | ORAL_TABLET | Freq: Every day | ORAL | Status: DC
  Filled 2015-11-17: qty 1

## 2015-11-17 MED ORDER — GABAPENTIN 600 MG PO TABS: 600.0000 mg | ORAL_TABLET | Freq: Three times a day (TID) | ORAL | Status: DC

## 2015-11-17 MED ORDER — SUCCINYLCHOLINE CHLORIDE 20 MG/ML IJ SOLN
INTRAMUSCULAR | Status: AC
  Filled 2015-11-17: qty 1

## 2015-11-17 MED ORDER — BACITRACIN ZINC 500 UNIT/GM EX OINT
TOPICAL_OINTMENT | CUTANEOUS | Status: DC | PRN
  Administered 2015-11-17: 1 via TOPICAL

## 2015-11-17 MED ORDER — PROPOFOL 10 MG/ML IV BOLUS
INTRAVENOUS | Status: DC | PRN
Start: 2015-11-17 — End: 2015-11-17
  Administered 2015-11-17: 100 mg via INTRAVENOUS

## 2015-11-17 MED ORDER — CEFAZOLIN SODIUM-DEXTROSE 2-4 GM/100ML-% IV SOLN
2.0000 g | Freq: Three times a day (TID) | INTRAVENOUS | Status: DC
  Administered 2015-11-17: 2 g via INTRAVENOUS
  Filled 2015-11-17: qty 100

## 2015-11-17 MED ORDER — ONDANSETRON HCL 4 MG/2ML IJ SOLN
INTRAMUSCULAR | Status: AC
  Filled 2015-11-17: qty 2

## 2015-11-17 MED ORDER — ONDANSETRON HCL 4 MG/2ML IJ SOLN
INTRAMUSCULAR | Status: DC | PRN
  Administered 2015-11-17: 4 mg via INTRAVENOUS

## 2015-11-17 MED ORDER — BISACODYL 10 MG RE SUPP: 10.0000 mg | Freq: Every day | RECTAL | Status: DC | PRN

## 2015-11-17 MED ORDER — MUPIROCIN 2 % EX OINT
TOPICAL_OINTMENT | CUTANEOUS | Status: AC
  Filled 2015-11-17: qty 22

## 2015-11-17 MED ORDER — MENTHOL 3 MG MT LOZG: 1.0000 | LOZENGE | OROMUCOSAL | Status: DC | PRN

## 2015-11-17 MED ORDER — LIDOCAINE HCL (CARDIAC) 20 MG/ML IV SOLN
INTRAVENOUS | Status: AC
  Filled 2015-11-17: qty 5

## 2015-11-17 MED ORDER — SUGAMMADEX SODIUM 200 MG/2ML IV SOLN
INTRAVENOUS | Status: AC
  Filled 2015-11-17: qty 2

## 2015-11-17 MED ORDER — THROMBIN 5000 UNITS EX SOLR
CUTANEOUS | Status: DC | PRN
  Administered 2015-11-17 (×2): 5000 [IU] via TOPICAL

## 2015-11-17 MED ORDER — 0.9 % SODIUM CHLORIDE (POUR BTL) OPTIME
TOPICAL | Status: DC | PRN
  Administered 2015-11-17: 1000 mL

## 2015-11-17 MED ORDER — DEXTROSE 5 % IV SOLN
2.0000 g | INTRAVENOUS | Status: AC
  Administered 2015-11-17: 2 g via INTRAVENOUS

## 2015-11-17 MED ORDER — MUPIROCIN 2 % EX OINT
1.0000 "application " | TOPICAL_OINTMENT | Freq: Once | CUTANEOUS | Status: AC
  Administered 2015-11-17: 1 via TOPICAL

## 2015-11-17 MED ORDER — BUPIVACAINE LIPOSOME 1.3 % IJ SUSP
INTRAMUSCULAR | Status: DC | PRN
  Administered 2015-11-17: 20 mL

## 2015-11-17 MED ORDER — PROPOFOL 10 MG/ML IV BOLUS
INTRAVENOUS | Status: AC
  Filled 2015-11-17: qty 20

## 2015-11-17 MED ORDER — DIAZEPAM 5 MG PO TABS: 5.0000 mg | ORAL_TABLET | Freq: Four times a day (QID) | ORAL | Status: DC | PRN

## 2015-11-17 MED ORDER — ACETAMINOPHEN 325 MG PO TABS: 650.0000 mg | ORAL_TABLET | ORAL | Status: DC | PRN

## 2015-11-17 MED ORDER — LACTATED RINGERS IV SOLN: INTRAVENOUS | Status: DC

## 2015-11-17 MED ORDER — OXYCODONE-ACETAMINOPHEN 5-325 MG PO TABS: 1.0000 | ORAL_TABLET | ORAL | Status: DC | PRN

## 2015-11-17 MED ORDER — ROCURONIUM BROMIDE 100 MG/10ML IV SOLN
INTRAVENOUS | Status: DC | PRN
  Administered 2015-11-17: 30 mg via INTRAVENOUS

## 2015-11-17 MED ORDER — BUPIVACAINE-EPINEPHRINE (PF) 0.5% -1:200000 IJ SOLN
INTRAMUSCULAR | Status: DC | PRN
  Administered 2015-11-17: 10 mL via PERINEURAL

## 2015-11-17 MED ORDER — DEXAMETHASONE SODIUM PHOSPHATE 10 MG/ML IJ SOLN
INTRAMUSCULAR | Status: DC | PRN
  Administered 2015-11-17: 10 mg via INTRAVENOUS

## 2015-11-17 MED ORDER — SODIUM CHLORIDE 0.9 % IR SOLN
Status: DC | PRN
  Administered 2015-11-17: 500 mL

## 2015-11-17 MED ORDER — BUPIVACAINE LIPOSOME 1.3 % IJ SUSP
20.0000 mL | INTRAMUSCULAR | Status: DC
  Filled 2015-11-17: qty 20

## 2015-11-17 MED ORDER — FENTANYL CITRATE (PF) 250 MCG/5ML IJ SOLN
INTRAMUSCULAR | Status: AC
  Filled 2015-11-17: qty 5

## 2015-11-17 MED ORDER — LIDOCAINE HCL (CARDIAC) 20 MG/ML IV SOLN
INTRAVENOUS | Status: DC | PRN
  Administered 2015-11-17: 100 mg via INTRAVENOUS
  Administered 2015-11-17: 50 mg via INTRAVENOUS

## 2015-11-17 MED ORDER — EPHEDRINE SULFATE 50 MG/ML IJ SOLN
INTRAMUSCULAR | Status: AC
  Filled 2015-11-17: qty 1

## 2015-11-17 MED ORDER — ONDANSETRON HCL 4 MG/2ML IJ SOLN: 4.0000 mg | Freq: Once | INTRAMUSCULAR | Status: DC | PRN

## 2015-11-17 MED ORDER — ACETAMINOPHEN 650 MG RE SUPP: 650.0000 mg | RECTAL | Status: DC | PRN

## 2015-11-17 MED ORDER — PHENYLEPHRINE 40 MCG/ML (10ML) SYRINGE FOR IV PUSH (FOR BLOOD PRESSURE SUPPORT)
PREFILLED_SYRINGE | INTRAVENOUS | Status: AC
  Filled 2015-11-17: qty 10

## 2015-11-17 MED ORDER — ONDANSETRON HCL 4 MG/2ML IJ SOLN: 4.0000 mg | INTRAMUSCULAR | Status: DC | PRN

## 2015-11-17 MED ORDER — AMLODIPINE BESYLATE 5 MG PO TABS
5.0000 mg | ORAL_TABLET | Freq: Every day | ORAL | Status: DC
  Filled 2015-11-17: qty 1

## 2015-11-17 MED ORDER — FENTANYL CITRATE (PF) 100 MCG/2ML IJ SOLN: 25.0000 ug | INTRAMUSCULAR | Status: DC | PRN

## 2015-11-17 MED ORDER — DEXAMETHASONE SODIUM PHOSPHATE 10 MG/ML IJ SOLN
INTRAMUSCULAR | Status: AC
  Filled 2015-11-17: qty 1

## 2015-11-17 MED ORDER — HYDROCODONE-ACETAMINOPHEN 10-325 MG PO TABS: 1.0000 | ORAL_TABLET | ORAL | Status: AC | PRN

## 2015-11-17 MED ORDER — EPHEDRINE SULFATE 50 MG/ML IJ SOLN
INTRAMUSCULAR | Status: DC | PRN
  Administered 2015-11-17: 10 mg via INTRAVENOUS
  Administered 2015-11-17: 5 mg via INTRAVENOUS
  Administered 2015-11-17 (×2): 10 mg via INTRAVENOUS

## 2015-11-17 MED ORDER — PHENOL 1.4 % MT LIQD: 1.0000 | OROMUCOSAL | Status: DC | PRN

## 2015-11-17 MED ORDER — HYDROCODONE-ACETAMINOPHEN 5-325 MG PO TABS: 1.0000 | ORAL_TABLET | ORAL | Status: DC | PRN

## 2015-11-17 MED ORDER — CYCLOBENZAPRINE HCL 10 MG PO TABS: 10.0000 mg | ORAL_TABLET | Freq: Three times a day (TID) | ORAL | Status: AC | PRN

## 2015-11-17 MED ORDER — PHENYLEPHRINE HCL 10 MG/ML IJ SOLN
INTRAMUSCULAR | Status: DC | PRN
  Administered 2015-11-17: 40 ug via INTRAVENOUS

## 2015-11-17 MED ORDER — HEMOSTATIC AGENTS (NO CHARGE) OPTIME
TOPICAL | Status: DC | PRN
  Administered 2015-11-17: 1 via TOPICAL

## 2015-11-17 MED ORDER — AMLODIPINE BESY-BENAZEPRIL HCL 5-10 MG PO CAPS: 1.0000 | ORAL_CAPSULE | Freq: Every day | ORAL | Status: DC

## 2015-11-17 MED ORDER — SUCCINYLCHOLINE CHLORIDE 20 MG/ML IJ SOLN
INTRAMUSCULAR | Status: DC | PRN
  Administered 2015-11-17: 100 mg via INTRAVENOUS

## 2015-11-17 MED ORDER — HYDROCODONE-ACETAMINOPHEN 10-325 MG PO TABS: 1.0000 | ORAL_TABLET | ORAL | Status: DC | PRN

## 2015-11-17 MED ORDER — VENLAFAXINE HCL 75 MG PO TABS
75.0000 mg | ORAL_TABLET | Freq: Every day | ORAL | Status: DC
  Filled 2015-11-17 (×2): qty 1

## 2015-11-17 MED ORDER — CEFAZOLIN SODIUM-DEXTROSE 2-4 GM/100ML-% IV SOLN
INTRAVENOUS | Status: AC
  Filled 2015-11-17: qty 100

## 2015-11-17 MED ORDER — MIDAZOLAM HCL 2 MG/2ML IJ SOLN
INTRAMUSCULAR | Status: AC
  Filled 2015-11-17: qty 2

## 2015-11-17 MED ORDER — LACTATED RINGERS IV SOLN
INTRAVENOUS | Status: DC | PRN
  Administered 2015-11-17 (×2): via INTRAVENOUS

## 2015-11-17 MED ORDER — SUGAMMADEX SODIUM 200 MG/2ML IV SOLN
INTRAVENOUS | Status: DC | PRN
  Administered 2015-11-17: 200 mg via INTRAVENOUS

## 2015-11-17 MED ORDER — ROCURONIUM BROMIDE 50 MG/5ML IV SOLN
INTRAVENOUS | Status: AC
  Filled 2015-11-17: qty 1

## 2015-11-17 MED ORDER — MORPHINE SULFATE (PF) 2 MG/ML IV SOLN: 1.0000 mg | INTRAVENOUS | Status: DC | PRN

## 2015-11-17 MED ORDER — CLORAZEPATE DIPOTASSIUM 7.5 MG PO TABS: 7.5000 mg | ORAL_TABLET | Freq: Every evening | ORAL | Status: DC | PRN

## 2015-11-17 MED ORDER — DOCUSATE SODIUM 100 MG PO CAPS: 100.0000 mg | ORAL_CAPSULE | Freq: Two times a day (BID) | ORAL | Status: DC

## 2015-11-17 MED ORDER — SODIUM CHLORIDE 0.9 % IJ SOLN
INTRAMUSCULAR | Status: AC
  Filled 2015-11-17: qty 10

## 2015-11-17 SURGICAL SUPPLY — 46 items
APL SKNCLS STERI-STRIP NONHPOA (GAUZE/BANDAGES/DRESSINGS) ×1
BAG DECANTER FOR FLEXI CONT (MISCELLANEOUS) ×3 IMPLANT
BENZOIN TINCTURE PRP APPL 2/3 (GAUZE/BANDAGES/DRESSINGS) ×3 IMPLANT
BLADE CLIPPER SURG (BLADE) IMPLANT
BRUSH SCRUB EZ PLAIN DRY (MISCELLANEOUS) ×3 IMPLANT
BUR MATCHSTICK NEURO 3.0 LAGG (BURR) ×3 IMPLANT
BUR PRECISION FLUTE 6.0 (BURR) ×3 IMPLANT
CANISTER SUCT 3000ML PPV (MISCELLANEOUS) ×3 IMPLANT
CLOSURE WOUND 1/2 X4 (GAUZE/BANDAGES/DRESSINGS) ×1
DRAPE LAPAROTOMY 100X72X124 (DRAPES) ×3 IMPLANT
DRAPE POUCH INSTRU U-SHP 10X18 (DRAPES) ×3 IMPLANT
DRAPE SURG 17X23 STRL (DRAPES) ×12 IMPLANT
ELECT BLADE 4.0 EZ CLEAN MEGAD (MISCELLANEOUS) ×3
ELECT REM PT RETURN 9FT ADLT (ELECTROSURGICAL) ×3
ELECTRODE BLDE 4.0 EZ CLN MEGD (MISCELLANEOUS) ×1 IMPLANT
ELECTRODE REM PT RTRN 9FT ADLT (ELECTROSURGICAL) ×1 IMPLANT
GAUZE SPONGE 4X4 12PLY STRL (GAUZE/BANDAGES/DRESSINGS) ×3 IMPLANT
GAUZE SPONGE 4X4 16PLY XRAY LF (GAUZE/BANDAGES/DRESSINGS) IMPLANT
GLOVE BIO SURGEON STRL SZ8 (GLOVE) ×3 IMPLANT
GLOVE BIO SURGEON STRL SZ8.5 (GLOVE) ×3 IMPLANT
GLOVE EXAM NITRILE LRG STRL (GLOVE) IMPLANT
GLOVE EXAM NITRILE MD LF STRL (GLOVE) IMPLANT
GLOVE EXAM NITRILE XL STR (GLOVE) IMPLANT
GLOVE EXAM NITRILE XS STR PU (GLOVE) IMPLANT
GOWN STRL REUS W/ TWL LRG LVL3 (GOWN DISPOSABLE) IMPLANT
GOWN STRL REUS W/ TWL XL LVL3 (GOWN DISPOSABLE) ×1 IMPLANT
GOWN STRL REUS W/TWL 2XL LVL3 (GOWN DISPOSABLE) IMPLANT
GOWN STRL REUS W/TWL LRG LVL3 (GOWN DISPOSABLE)
GOWN STRL REUS W/TWL XL LVL3 (GOWN DISPOSABLE) ×3
KIT BASIN OR (CUSTOM PROCEDURE TRAY) ×3 IMPLANT
KIT ROOM TURNOVER OR (KITS) ×3 IMPLANT
NDL HYPO 21X1.5 SAFETY (NEEDLE) IMPLANT
NEEDLE HYPO 21X1.5 SAFETY (NEEDLE) IMPLANT
NEEDLE HYPO 22GX1.5 SAFETY (NEEDLE) ×3 IMPLANT
NS IRRIG 1000ML POUR BTL (IV SOLUTION) ×3 IMPLANT
PACK LAMINECTOMY NEURO (CUSTOM PROCEDURE TRAY) ×3 IMPLANT
PAD ARMBOARD 7.5X6 YLW CONV (MISCELLANEOUS) ×9 IMPLANT
PATTIES SURGICAL .5 X1 (DISPOSABLE) IMPLANT
SPONGE SURGIFOAM ABS GEL SZ50 (HEMOSTASIS) ×3 IMPLANT
STRIP CLOSURE SKIN 1/2X4 (GAUZE/BANDAGES/DRESSINGS) ×2 IMPLANT
SUT VIC AB 1 CT1 18XBRD ANBCTR (SUTURE) ×1 IMPLANT
SUT VIC AB 1 CT1 8-18 (SUTURE) ×3
SUT VIC AB 2-0 CP2 18 (SUTURE) ×3 IMPLANT
TOWEL OR 17X24 6PK STRL BLUE (TOWEL DISPOSABLE) ×3 IMPLANT
TOWEL OR 17X26 10 PK STRL BLUE (TOWEL DISPOSABLE) ×3 IMPLANT
WATER STERILE IRR 1000ML POUR (IV SOLUTION) ×3 IMPLANT

## 2015-11-17 NOTE — Anesthesia Postprocedure Evaluation (Signed)
Anesthesia Post Note  Patient: Hannah Henson  Procedure(s) Performed: Procedure(s) (LRB): removal of Right L4 L5 pedicle screws (Right)  Patient location during evaluation: PACU Anesthesia Type: General Level of consciousness: awake and alert Pain management: pain level controlled Vital Signs Assessment: post-procedure vital signs reviewed and stable Respiratory status: spontaneous breathing, nonlabored ventilation, respiratory function stable and patient connected to nasal cannula oxygen Cardiovascular status: blood pressure returned to baseline and stable Postop Assessment: no signs of nausea or vomiting Anesthetic complications: no    Last Vitals:  Filed Vitals:   11/17/15 1117 11/17/15 1613  BP: 133/70 110/62  Pulse: 77 80  Temp: 36.4 C 36.8 C  Resp: 16 16    Last Pain: There were no vitals filed for this visit.               Cecile HearingStephen Edward Shannan Slinker

## 2015-11-17 NOTE — Progress Notes (Signed)
Discharged instructions/education/Rx given to patient with family at bedside and they both verbalized understanding. Pain is low to moderate and controlled by PRN pain pills. Patient walked in hallway well, voiding with no difficulty and tolerated her dinner well. No drainage, no redness and no swelling on incision site. Patient d/c via wheelchair.

## 2015-11-17 NOTE — Transfer of Care (Signed)
Immediate Anesthesia Transfer of Care Note  Patient: Hannah MilchLynnette M Burkhammer  Procedure(s) Performed: Procedure(s) with comments: removal of Right L4 L5 pedicle screws (Right) - Removal of Right L4 L5 pedicle screws  Patient Location: PACU  Anesthesia Type:General  Level of Consciousness: awake  Airway & Oxygen Therapy: Patient Spontanous Breathing and Patient connected to nasal cannula oxygen  Post-op Assessment: Report given to RN, Post -op Vital signs reviewed and stable and Patient moving all extremities X 4  Post vital signs: stable  Last Vitals:  Filed Vitals:   11/17/15 0657 11/17/15 0706  BP: 145/91   Pulse: 75   Temp:  36.8 C  Resp: 18     Complications: No apparent anesthesia complications

## 2015-11-17 NOTE — H&P (Signed)
Subjective: The patient is a 70 year old white female on whom I performed an L4-5 decompression and fusion. She has developed a foot drop. A CT scan demonstrated the right L5 pedicle screw was medial. I discussed the situation with the patient. We discussed the various treatment options including removal of the screw. She has decided to proceed with surgery.   Past Medical History  Diagnosis Date  . Hypertension   . Anxiety   . Psoriasis     plaque psoriasis  . Complication of anesthesia     "foggy, forgetfulness"- a couple weeks or more  . Family history of adverse reaction to anesthesia   . Macular degeneration   . Foot drop, right   . History of blood transfusion     Past Surgical History  Procedure Laterality Date  . Lumbar fusion    . Joint replacement Left 2006    L KNEE  . Hemorrhoid surgery    . Colonoscopy      Allergies  Allergen Reactions  . Tetracyclines & Related Hives    Social History  Substance Use Topics  . Smoking status: Former Smoker    Types: Cigarettes  . Smokeless tobacco: Never Used     Comment: high scholl - experimental   . Alcohol Use: No     Comment: very rare    History reviewed. No pertinent family history. Prior to Admission medications   Medication Sig Start Date End Date Taking? Authorizing Provider  Adalimumab (HUMIRA PEN) 40 MG/0.8ML PNKT Inject 40 mg into the skin every 14 (fourteen) days. Last injection July 26/2016   Yes Historical Provider, MD  amLODipine-benazepril (LOTREL) 5-10 MG per capsule Take 1 capsule by mouth daily. 02/02/15  Yes Historical Provider, MD  Calcium Carb-Cholecalciferol (CALCIUM + D3 PO) Take 2 tablets by mouth daily. Vitamin D 2500 I.U., calcium 100 mg (per tablet)   Yes Historical Provider, MD  cholecalciferol (VITAMIN D) 1000 units tablet Take 2,500 Units by mouth daily.   Yes Historical Provider, MD  clorazepate (TRANXENE) 7.5 MG tablet Take 7.5 mg by mouth daily as needed for anxiety. anxiety 02/07/15  Yes  Historical Provider, MD  co-enzyme Q-10 30 MG capsule Take 200 mg by mouth daily.   Yes Historical Provider, MD  diazepam (VALIUM) 5 MG tablet Take 1 tablet (5 mg total) by mouth every 6 (six) hours as needed for muscle spasms. Patient taking differently: Take 5 mg by mouth daily as needed for muscle spasms.  03/07/15  Yes Shirlean Kelly, MD  gabapentin (NEURONTIN) 600 MG tablet Take 1 tablet (600 mg total) by mouth 3 (three) times daily. Patient taking differently: Take 300-600 mg by mouth 2 (two) times daily as needed (for pain).  03/07/15  Yes Shirlean Kelly, MD  Melatonin 3 MG TABS Take 3 mg by mouth at bedtime as needed (sleep).   Yes Historical Provider, MD  OVER THE COUNTER MEDICATION Take 1,600 mg by mouth daily. Cholestra Care #2 800 mg tablets   Yes Historical Provider, MD  OVER THE COUNTER MEDICATION Take 1 tablet by mouth 2 (two) times daily. Retinavites   Yes Historical Provider, MD  PRESCRIPTION MEDICATION Take 0.25 Troches by mouth 2 (two) times daily. (Estradiol/estriol 50/50 0.5 mg; progesterone 125 mg; testosterone 1.25 mg) per troche compounded by [employer], [place]. Pleasant,    Yes Historical Provider, MD  ranibizumab (LUCENTIS) 0.5 MG/0.05ML SOLN 0.5 mg by Intravitreal route every 6 (six) weeks.   Yes Historical Provider, MD  venlafaxine (EFFEXOR) 75 MG  tablet Take 75 mg by mouth every morning.  02/02/15  Yes Historical Provider, MD     Review of Systems  Positive ROS: As above  All other systems have been reviewed and were otherwise negative with the exception of those mentioned in the HPI and as above.  Objective: Vital signs in last 24 hours: Temp:  [98.3 F (36.8 C)] 98.3 F (36.8 C) (04/19 0706) Pulse Rate:  [75] 75 (04/19 0657) Resp:  [18] 18 (04/19 0657) BP: (145)/(91) 145/91 mmHg (04/19 0657) SpO2:  [98 %] 98 % (04/19 0657) Weight:  [79.379 kg (175 lb)] 79.379 kg (175 lb) (04/19 0657)  General Appearance: Alert, cooperative, no distress, Head:  Normocephalic, without obvious abnormality, atraumatic Eyes: PERRL, conjunctiva/corneas clear, EOM's intact,    Ears: Normal  Throat: Normal  Neck: Supple, symmetrical, trachea midline, no adenopathy; thyroid: No enlargement/tenderness/nodules; no carotid bruit or JVD Back: Symmetric, no curvature, ROM normal, no CVA tenderness Lungs: Clear to auscultation bilaterally, respirations unlabored Heart: Regular rate and rhythm, no murmur, rub or gallop Abdomen: Soft, non-tender,, no masses, no organomegaly Extremities: Extremities normal, atraumatic, no cyanosis or edema Pulses: 2+ and symmetric all extremities Skin: Skin color, texture, turgor normal, no rashes or lesions  NEUROLOGIC:   Mental status: alert and oriented, no aphasia, good attention span, Fund of knowledge/ memory ok Motor Exam - grossly normal except she has a right foot drop Sensory Exam - grossly normal she has a right L5 numbness. Reflexes:  Coordination - grossly normal Gait - grossly normal Balance - grossly normal Cranial Nerves: I: smell Not tested  II: visual acuity  OS: Normal  OD: Normal   II: visual fields Full to confrontation  II: pupils Equal, round, reactive to light  III,VII: ptosis None  III,IV,VI: extraocular muscles  Full ROM  V: mastication Normal  V: facial light touch sensation  Normal  V,VII: corneal reflex  Present  VII: facial muscle function - upper  Normal  VII: facial muscle function - lower Normal  VIII: hearing Not tested  IX: soft palate elevation  Normal  IX,X: gag reflex Present  XI: trapezius strength  5/5  XI: sternocleidomastoid strength 5/5  XI: neck flexion strength  5/5  XII: tongue strength  Normal    Data Review Lab Results  Component Value Date   WBC 6.7 11/17/2015   HGB 12.8 11/17/2015   HCT 38.9 11/17/2015   MCV 92.4 11/17/2015   PLT 259 11/17/2015   Lab Results  Component Value Date   NA 137 11/17/2015   K 3.9 11/17/2015   CL 103 11/17/2015   CO2 27  11/17/2015   BUN 19 11/17/2015   CREATININE 0.81 11/17/2015   GLUCOSE 82 11/17/2015   No results found for: INR, PROTIME  Assessment/Plan: Malplaced right L5 pedicle screw: I discussed the situation with the patient. We have discussed the various treatment options including removal of the screw. We discussed the surgery. We discussed the risks, benefits, alternatives, and likelihood of achieving her goals with surgery. She has decided to proceed with surgery.   Thunder [place] D 11/17/2015 8:29 AM

## 2015-11-17 NOTE — Op Note (Signed)
Brief history: The patient is a 70 year old white female on whom I performed a L4-5 decompression and fusion. The patient has developed a right foot drop. A postoperative CT scan demonstrated the right L5 pedicle screw was caudally displaced. I discussed the situation with the patient and her husband and reviewed the imaging studies with him. We have discussed the various treatment options including removal of the right-sided instrumentation. I described the surgery as well as the risks, benefits, alternatives, and likelihood of achieving her goals with surgery. I have answered all her questions. She has decided to proceed with surgery.  Preop diagnosis: Malplaced right L5 pedicle screw  Postop diagnosis: Same  Procedure: Removal of right L4 and L5 instrumentation  Surgeon: Dr. Delma OfficerJeff Desree Leap  Assistant: None  Anesthesia: Gen. endotracheal  Estimated blood loss: Minimal  Specimens: None  Drains: None  Complications: None  Description of procedure: The patient was brought to the operating room by the anesthesia team. General endotracheal anesthesia was induced. The patient was turned to the prone position on the Wilson frame. Her lumbosacral region was then prepared with Betadine scrub and Betadine solution. Sterile drapes were applied. I then injected the area to be incised with Marcaine with epinephrine solution. A scalpel to make a linear incision over the L4-5 interspace incising through the old surgical scar. A loose electrocautery to perform a right-sided subperiosteal dissection exposing the right spinous process and lamina of L4-L5 and the right-sided instrumentation. I inserted the McCullough retractor for exposure. I then removed the capsule from the L4 and L5 pedicle screws. I then removed the rod. I then removed both pedicle screws. There was no evidence of spinal fluid leakage. I obtained hemostasis using bipolar cautery. I irrigated the wound out with bacitracin solution. I injected  Experil  into the paraspinous musculature. I then removed the retractors and then reapproximated the thoracic lumbar fashion with interrupted #1 Vicryl suture. I reapproximated the subcutaneous suture with interrupted 2-0 Vicryl suture. I reapproximated the skin with Steri-Strips and benzoin. The wound was then coated with bacitracin ointment. A sterile dressing was applied. The drapes were removed. The patient was subsequently returned to the supine position and extubated by the anesthesia team. By report all sponge, isthmic, and needle counts were correct at the end this case.

## 2015-11-17 NOTE — Progress Notes (Signed)
Subjective:  The patient is somnolent but easily arousable. She is in no apparent distress.  Objective: Vital signs in last 24 hours: Temp:  [97.7 F (36.5 C)-98.3 F (36.8 C)] 97.7 F (36.5 C) (04/19 1000) Pulse Rate:  [75-88] 86 (04/19 1003) Resp:  [14-18] 14 (04/19 1003) BP: (124-145)/(75-91) 124/75 mmHg (04/19 1003) SpO2:  [96 %-99 %] 96 % (04/19 1003) Weight:  [79.379 kg (175 lb)] 79.379 kg (175 lb) (04/19 0657)  Intake/Output from previous day:   Intake/Output this shift: Total I/O In: 1200 [I.V.:1200] Out: 425 [Urine:400; Blood:25]  Physical exam the patient is somewhat but arousable. She is moving her lower extremities well.  Lab Results:  Recent Labs  11/17/15 0647  WBC 6.7  HGB 12.8  HCT 38.9  PLT 259   BMET  Recent Labs  11/17/15 0647  NA 137  K 3.9  CL 103  CO2 27  GLUCOSE 82  BUN 19  CREATININE 0.81  CALCIUM 9.1    Studies/Results: No results found.  Assessment/Plan: The patient is doing well.      Giancarlo Askren D 11/17/2015, 10:15 AM

## 2015-11-17 NOTE — Anesthesia Procedure Notes (Signed)
Procedure Name: Intubation Date/Time: 11/17/2015 8:48 AM Performed by: Little IshikawaMERCER, Milla Wahlberg L Pre-anesthesia Checklist: Patient identified, Timeout performed, Emergency Drugs available, Suction available and Patient being monitored Patient Re-evaluated:Patient Re-evaluated prior to inductionOxygen Delivery Method: Circle system utilized Preoxygenation: Pre-oxygenation with 100% oxygen Intubation Type: IV induction Ventilation: Mask ventilation without difficulty Laryngoscope Size: Mac and 3 Grade View: Grade I Tube type: Oral Tube size: 7.0 mm Number of attempts: 1 Airway Equipment and Method: Stylet Placement Confirmation: ETT inserted through vocal cords under direct vision,  positive ETCO2 and breath sounds checked- equal and bilateral Secured at: 21 cm Tube secured with: Tape Dental Injury: Teeth and Oropharynx as per pre-operative assessment

## 2015-11-17 NOTE — Discharge Summary (Signed)
Physician Discharge Summary  Patient ID: Hannah MilchLynnette M Siek MRN: [id number] DOB/AGE: 70/12/1945 70 y.o.  Admit date: 11/17/2015 Discharge date: 11/17/2015  Admission Diagnoses:Malplaced pedicle screw, lumbar radiculopathy  Discharge Diagnoses: The same Active Problems:   Lumbar radiculopathy   Discharged Condition: good  Hospital Course: I removed the patient's right L4 and L5 pedicle screws on 11/17/2015. The surgery went well.  The patient's postoperative course was unremarkable. On the evening of surgery the patient was doing well and requested discharge to home. The patient, and her husband, were given written and oral discharge instructions. All other questions were answered.  Consults: None Significant Diagnostic Studies: None Treatments: Removal of right L4-5 instrumentation Discharge Exam: Blood pressure 110/62, pulse 80, temperature 98.3 F (36.8 C), resp. rate 16, height 5' 5.5" (1.664 m), weight 79.379 kg (175 lb), SpO2 94 %. The patient is alert and pleasant. She is moving her lower extremities well.  Disposition: Home  Discharge Instructions     Remove dressing in 72 hours    Complete by:  As directed      Call MD for:  difficulty breathing, headache or visual disturbances    Complete by:  As directed      Call MD for:  extreme fatigue    Complete by:  As directed      Call MD for:  hives    Complete by:  As directed      Call MD for:  persistant dizziness or light-headedness    Complete by:  As directed      Call MD for:  persistant nausea and vomiting    Complete by:  As directed      Call MD for:  redness, tenderness, or signs of infection (pain, swelling, redness, odor or green/yellow discharge around incision site)    Complete by:  As directed      Call MD for:  severe uncontrolled pain    Complete by:  As directed      Call MD for:  temperature >100.4    Complete by:  As directed      Diet - low sodium heart healthy    Complete by:  As directed       Discharge instructions    Complete by:  As directed   Call [phone] for a followup appointment. Take a stool softener while you are using pain medications.     Driving Restrictions    Complete by:  As directed   Do not drive for 2 weeks.     Increase activity slowly    Complete by:  As directed      Lifting restrictions    Complete by:  As directed   Do not lift more than 5 pounds. No excessive bending or twisting.     May shower / Bathe    Complete by:  As directed   He may shower after the pain she is removed 3 days after surgery. Leave the incision alone.            Medication List    STOP taking these medications        diazepam 5 MG tablet  Commonly known as:  VALIUM      TAKE these medications        amLODipine-benazepril 5-10 MG capsule  Commonly known as:  LOTREL  Take 1 capsule by mouth daily.     CALCIUM + D3 PO  Take 2 tablets by mouth daily. Vitamin D 2500 I.U., calcium 100 mg (per  tablet)     cholecalciferol 1000 units tablet  Commonly known as:  VITAMIN D  Take 2,500 Units by mouth daily.     clorazepate 7.5 MG tablet  Commonly known as:  TRANXENE  Take 7.5 mg by mouth daily as needed for anxiety. anxiety     co-enzyme Q-10 30 MG capsule  Take 200 mg by mouth daily.     cyclobenzaprine 10 MG tablet  Commonly known as:  FLEXERIL  Take 1 tablet (10 mg total) by mouth 3 (three) times daily as needed.     gabapentin 600 MG tablet  Commonly known as:  NEURONTIN  Take 1 tablet (600 mg total) by mouth 3 (three) times daily.     HUMIRA PEN 40 MG/0.8ML Pnkt  Generic drug:  Adalimumab  Inject 40 mg into the skin every 14 (fourteen) days. Last injection July 26/2016     HYDROcodone-acetaminophen 10-325 MG tablet  Commonly known as:  NORCO  Take 1 tablet by mouth every 4 (four) hours as needed for moderate pain.     LUCENTIS 0.5 MG/0.05ML Soln  Generic drug:  ranibizumab  0.5 mg by Intravitreal route every 6 (six) weeks.     Melatonin 3 MG Tabs   Take 3 mg by mouth at bedtime as needed (sleep).     OVER THE COUNTER MEDICATION  Take 1,600 mg by mouth daily. Cholestra Care #2 800 mg tablets     OVER THE COUNTER MEDICATION  Take 1 tablet by mouth 2 (two) times daily. Retinavites     PRESCRIPTION MEDICATION  Take 0.25 Troches by mouth 2 (two) times daily. (Estradiol/estriol 50/50 0.5 mg; progesterone 125 mg; testosterone 1.25 mg) per troche compounded by [employer], [place], [place]     venlafaxine 75 MG tablet  Commonly known as:  EFFEXOR  Take 75 mg by mouth every morning.         SignedCristi Loron 11/17/2015, 6:21 PM

## 2015-11-18 ENCOUNTER — Encounter (HOSPITAL_COMMUNITY): Payer: Self-pay | Admitting: Neurosurgery

## 2016-12-09 IMAGING — CR DG SPINE 1V PORT
1 series · 1 of 1 positions shown · non-contrast
Comparison: Lumbar spine radiographs 11/16/2014. Lumbar spine MRI
10/26/2014 and 04/18/2007.

CLINICAL DATA: Lumbar spine surgery.

EXAM:
PORTABLE SPINE - 1 VIEW

[lat]
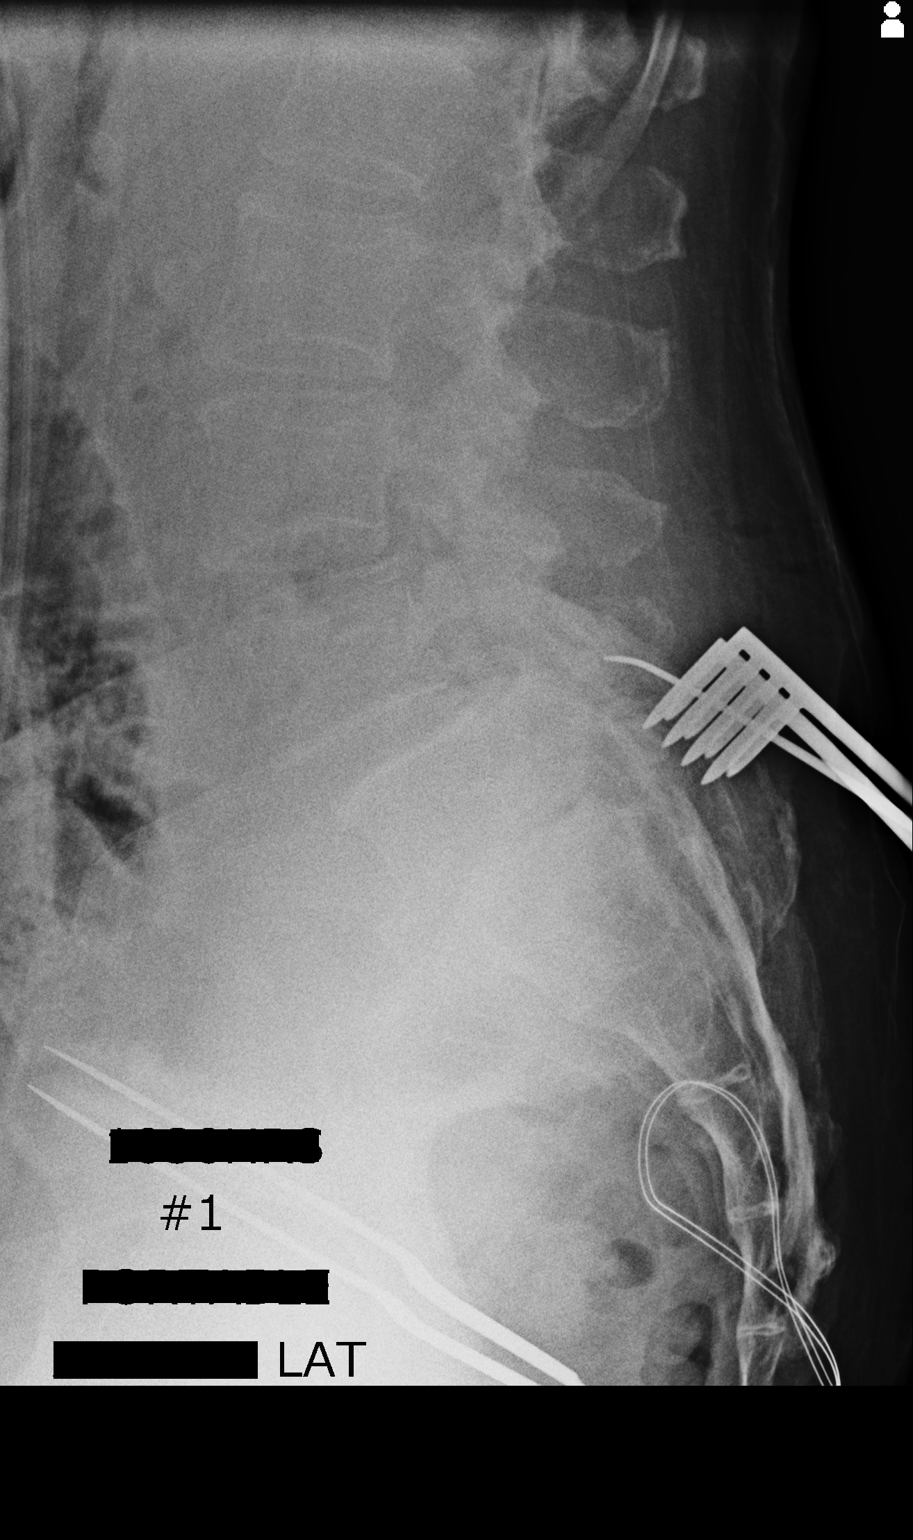

[1 of 1 positions shown; findings below may reference images not displayed]

FINDINGS: There is transitional lumbosacral anatomy. The vertebrae will be
numbered as on the 04/18/2007 MRI, with S1 being considered
lumbarized. There is slight anterolisthesis of L4 on L5 with more
pronounced anterolisthesis of L5 on S1. The tip of a surgical probe
projects posterior to the L5-S1 disc space, overlying the L5 spinous
process.
IMPRESSION: Intraoperative image for localization purposes as above.

These results were called by telephone at the time of interpretation
on 03/04/2015 at [DATE] to Dr. MILLY OLUHLE , who verbally
acknowledged these results.

## 2023-01-29 DEATH — deceased
